# Patient Record
Sex: Female | Born: 1935 | Race: White | Hispanic: No | Smoking: Never smoker
Health system: Southern US, Community
[De-identification: ages and names within clinical notes are randomized; demographics above are authoritative.]

## PROBLEM LIST (undated history)

## (undated) DIAGNOSIS — F411 Generalized anxiety disorder: Secondary | ICD-10-CM

## (undated) DIAGNOSIS — G4700 Insomnia, unspecified: Secondary | ICD-10-CM

## (undated) DIAGNOSIS — IMO0002 Reserved for concepts with insufficient information to code with codable children: Secondary | ICD-10-CM

## (undated) DIAGNOSIS — F0390 Unspecified dementia without behavioral disturbance: Secondary | ICD-10-CM

---

## 2012-03-22 ENCOUNTER — Emergency Department: Payer: Self-pay | Admitting: Emergency Medicine

## 2012-03-22 LAB — CBC
HCT: 41.3 % (ref 35.0–47.0)
HGB: 13.9 g/dL (ref 12.0–16.0)
MCH: 32.3 pg (ref 26.0–34.0)
MCHC: 33.6 g/dL (ref 32.0–36.0)
MCV: 96 fL (ref 80–100)
RBC: 4.3 10*6/uL (ref 3.80–5.20)
RDW: 13.8 % (ref 11.5–14.5)
WBC: 6.5 10*3/uL (ref 3.6–11.0)

## 2012-03-22 LAB — COMPREHENSIVE METABOLIC PANEL
Albumin: 3.8 g/dL (ref 3.4–5.0)
Anion Gap: 8 (ref 7–16)
BUN: 10 mg/dL (ref 7–18)
Bilirubin,Total: 0.3 mg/dL (ref 0.2–1.0)
Calcium, Total: 9.3 mg/dL (ref 8.5–10.1)
Chloride: 109 mmol/L — ABNORMAL HIGH (ref 98–107)
EGFR (African American): >60
Glucose: 86 mg/dL (ref 65–99)
Osmolality: 280 (ref 275–301)
SGOT(AST): 21 U/L (ref 15–37)
SGPT (ALT): 23 U/L (ref 12–78)
Sodium: 141 mmol/L (ref 136–145)

## 2012-03-22 LAB — TROPONIN I: Troponin-I: <0.02 ng/mL

## 2012-03-22 LAB — CK TOTAL AND CKMB (NOT AT ARMC)
CK, Total: 62 U/L (ref 21–215)
CK-MB: 1 ng/mL (ref 0.5–3.6)

## 2012-04-12 ENCOUNTER — Emergency Department: Payer: Self-pay | Admitting: Emergency Medicine

## 2012-04-12 LAB — LIPASE, BLOOD: Lipase: 100 U/L (ref 73–393)

## 2012-04-12 LAB — COMPREHENSIVE METABOLIC PANEL
Alkaline Phosphatase: 139 U/L — ABNORMAL HIGH (ref 50–136)
Anion Gap: 6 — ABNORMAL LOW (ref 7–16)
BUN: 10 mg/dL (ref 7–18)
Bilirubin,Total: 0.6 mg/dL (ref 0.2–1.0)
Chloride: 107 mmol/L (ref 98–107)
Creatinine: 0.76 mg/dL (ref 0.60–1.30)
EGFR (African American): >60
EGFR (Non-African Amer.): >60
Osmolality: 277 (ref 275–301)
SGOT(AST): 49 U/L — ABNORMAL HIGH (ref 15–37)
Total Protein: 7 g/dL (ref 6.4–8.2)

## 2012-04-12 LAB — URINALYSIS, COMPLETE
Bacteria: NONE SEEN
Bilirubin,UR: NEGATIVE
Blood: NEGATIVE
Glucose,UR: NEGATIVE mg/dL (ref 0–75)
Leukocyte Esterase: NEGATIVE
Nitrite: NEGATIVE
Ph: 6 (ref 4.5–8.0)
Protein: NEGATIVE
Specific Gravity: 1.013 (ref 1.003–1.030)

## 2012-04-12 LAB — CBC
MCH: 32.8 pg (ref 26.0–34.0)
MCHC: 34.4 g/dL (ref 32.0–36.0)
MCV: 95 fL (ref 80–100)
RDW: 13.3 % (ref 11.5–14.5)
WBC: 6.5 10*3/uL (ref 3.6–11.0)

## 2012-04-12 LAB — TROPONIN I: Troponin-I: <0.02 ng/mL

## 2012-04-12 LAB — CK TOTAL AND CKMB (NOT AT ARMC): CK, Total: 118 U/L (ref 21–215)

## 2013-05-15 ENCOUNTER — Emergency Department: Payer: Self-pay | Admitting: Emergency Medicine

## 2013-05-15 LAB — BASIC METABOLIC PANEL
ANION GAP: 5 — AB (ref 7–16)
BUN: 13 mg/dL (ref 7–18)
CREATININE: 0.99 mg/dL (ref 0.60–1.30)
Calcium, Total: 9.2 mg/dL (ref 8.5–10.1)
Chloride: 106 mmol/L (ref 98–107)
Co2: 27 mmol/L (ref 21–32)
EGFR (African American): >60
GFR CALC NON AF AMER: 54 — AB
GLUCOSE: 110 mg/dL — AB (ref 65–99)
OSMOLALITY: 276 (ref 275–301)
Potassium: 3.4 mmol/L — ABNORMAL LOW (ref 3.5–5.1)
SODIUM: 138 mmol/L (ref 136–145)

## 2013-05-15 LAB — URINALYSIS, COMPLETE
BACTERIA: NONE SEEN
BLOOD: NEGATIVE
Bilirubin,UR: NEGATIVE
Glucose,UR: NEGATIVE mg/dL (ref 0–75)
KETONE: NEGATIVE
Nitrite: NEGATIVE
Ph: 6 (ref 4.5–8.0)
Protein: NEGATIVE
Specific Gravity: 1.005 (ref 1.003–1.030)
Squamous Epithelial: <1

## 2013-05-15 LAB — CBC
HCT: 40.3 % (ref 35.0–47.0)
HGB: 13.5 g/dL (ref 12.0–16.0)
MCH: 31.4 pg (ref 26.0–34.0)
MCHC: 33.5 g/dL (ref 32.0–36.0)
MCV: 94 fL (ref 80–100)
Platelet: 222 10*3/uL (ref 150–440)
RBC: 4.3 10*6/uL (ref 3.80–5.20)
RDW: 13.3 % (ref 11.5–14.5)
WBC: 5.3 10*3/uL (ref 3.6–11.0)

## 2013-06-24 ENCOUNTER — Inpatient Hospital Stay: Payer: Self-pay | Admitting: Internal Medicine

## 2013-06-24 LAB — COMPREHENSIVE METABOLIC PANEL
AST: 23 U/L (ref 15–37)
Albumin: 3.5 g/dL (ref 3.4–5.0)
Alkaline Phosphatase: 160 U/L — ABNORMAL HIGH
Anion Gap: 5 — ABNORMAL LOW (ref 7–16)
BILIRUBIN TOTAL: 0.3 mg/dL (ref 0.2–1.0)
BUN: 9 mg/dL (ref 7–18)
CO2: 27 mmol/L (ref 21–32)
CREATININE: 1.04 mg/dL (ref 0.60–1.30)
Calcium, Total: 9.4 mg/dL (ref 8.5–10.1)
Chloride: 109 mmol/L — ABNORMAL HIGH (ref 98–107)
EGFR (African American): 59 — ABNORMAL LOW
EGFR (Non-African Amer.): 51 — ABNORMAL LOW
Glucose: 104 mg/dL — ABNORMAL HIGH (ref 65–99)
OSMOLALITY: 280 (ref 275–301)
POTASSIUM: 3.8 mmol/L (ref 3.5–5.1)
SGPT (ALT): 31 U/L (ref 12–78)
Sodium: 141 mmol/L (ref 136–145)
Total Protein: 7 g/dL (ref 6.4–8.2)

## 2013-06-24 LAB — SEDIMENTATION RATE: Erythrocyte Sed Rate: 11 mm/hr (ref 0–30)

## 2013-06-24 LAB — CBC
HCT: 41.5 % (ref 35.0–47.0)
HGB: 13.2 g/dL (ref 12.0–16.0)
MCH: 30.4 pg (ref 26.0–34.0)
MCHC: 31.9 g/dL — AB (ref 32.0–36.0)
MCV: 96 fL (ref 80–100)
Platelet: 206 10*3/uL (ref 150–440)
RBC: 4.34 10*6/uL (ref 3.80–5.20)
RDW: 13.9 % (ref 11.5–14.5)
WBC: 4.9 10*3/uL (ref 3.6–11.0)

## 2013-06-25 LAB — CBC WITH DIFFERENTIAL/PLATELET
BASOS ABS: 0 10*3/uL (ref 0.0–0.1)
Basophil %: 0.6 %
EOS ABS: 0.1 10*3/uL (ref 0.0–0.7)
EOS PCT: 2.1 %
HCT: 38.2 % (ref 35.0–47.0)
HGB: 12.7 g/dL (ref 12.0–16.0)
Lymphocyte #: 2.1 10*3/uL (ref 1.0–3.6)
Lymphocyte %: 42.4 %
MCH: 31.7 pg (ref 26.0–34.0)
MCHC: 33.1 g/dL (ref 32.0–36.0)
MCV: 96 fL (ref 80–100)
MONOS PCT: 8.2 %
Monocyte #: 0.4 x10 3/mm (ref 0.2–0.9)
NEUTROS ABS: 2.4 10*3/uL (ref 1.4–6.5)
NEUTROS PCT: 46.7 %
Platelet: 195 10*3/uL (ref 150–440)
RBC: 4 10*6/uL (ref 3.80–5.20)
RDW: 13.6 % (ref 11.5–14.5)
WBC: 5.1 10*3/uL (ref 3.6–11.0)

## 2013-06-25 LAB — MAGNESIUM: MAGNESIUM: 1.9 mg/dL

## 2013-07-09 ENCOUNTER — Ambulatory Visit: Payer: Self-pay | Admitting: Orthopedic Surgery

## 2013-07-09 DIAGNOSIS — I10 Essential (primary) hypertension: Secondary | ICD-10-CM

## 2013-07-09 LAB — CBC
HCT: 39 % (ref 35.0–47.0)
HGB: 13 g/dL (ref 12.0–16.0)
MCH: 32.2 pg (ref 26.0–34.0)
MCHC: 33.2 g/dL (ref 32.0–36.0)
MCV: 97 fL (ref 80–100)
PLATELETS: 220 10*3/uL (ref 150–440)
RBC: 4.02 10*6/uL (ref 3.80–5.20)
RDW: 14 % (ref 11.5–14.5)
WBC: 5.4 10*3/uL (ref 3.6–11.0)

## 2013-07-09 LAB — PROTIME-INR
INR: 0.9
Prothrombin Time: 12.3 secs (ref 11.5–14.7)

## 2013-07-09 LAB — BASIC METABOLIC PANEL
Anion Gap: 6 — ABNORMAL LOW (ref 7–16)
BUN: 14 mg/dL (ref 7–18)
CHLORIDE: 108 mmol/L — AB (ref 98–107)
CO2: 27 mmol/L (ref 21–32)
Calcium, Total: 8.8 mg/dL (ref 8.5–10.1)
Creatinine: 0.91 mg/dL (ref 0.60–1.30)
EGFR (African American): >60
EGFR (Non-African Amer.): 60 — ABNORMAL LOW
Glucose: 156 mg/dL — ABNORMAL HIGH (ref 65–99)
Osmolality: 285 (ref 275–301)
POTASSIUM: 3.6 mmol/L (ref 3.5–5.1)
Sodium: 141 mmol/L (ref 136–145)

## 2013-07-09 LAB — URINALYSIS, COMPLETE
Bacteria: NONE SEEN
Bilirubin,UR: NEGATIVE
Blood: NEGATIVE
GLUCOSE, UR: NEGATIVE mg/dL (ref 0–75)
KETONE: NEGATIVE
Nitrite: NEGATIVE
PROTEIN: NEGATIVE
Ph: 5 (ref 4.5–8.0)
RBC,UR: <1 /HPF (ref 0–5)
SPECIFIC GRAVITY: 1.014 (ref 1.003–1.030)
WBC UR: 5 /HPF (ref 0–5)

## 2013-07-09 LAB — MRSA PCR SCREENING

## 2013-07-09 LAB — SEDIMENTATION RATE: Erythrocyte Sed Rate: 14 mm/hr (ref 0–30)

## 2013-07-09 LAB — APTT: Activated PTT: 26.9 secs (ref 23.6–35.9)

## 2013-07-17 ENCOUNTER — Inpatient Hospital Stay: Payer: Self-pay | Admitting: Orthopedic Surgery

## 2013-07-17 LAB — HEMOGLOBIN: HGB: 12.2 g/dL (ref 12.0–16.0)

## 2013-07-18 LAB — CBC WITH DIFFERENTIAL/PLATELET
Basophil #: 0 10*3/uL (ref 0.0–0.1)
Basophil %: 0.2 %
EOS ABS: 0 10*3/uL (ref 0.0–0.7)
Eosinophil %: 0 %
HCT: 27.1 % — ABNORMAL LOW (ref 35.0–47.0)
HGB: 9.4 g/dL — ABNORMAL LOW (ref 12.0–16.0)
LYMPHS ABS: 1 10*3/uL (ref 1.0–3.6)
LYMPHS PCT: 12.1 %
MCH: 33 pg (ref 26.0–34.0)
MCHC: 34.5 g/dL (ref 32.0–36.0)
MCV: 96 fL (ref 80–100)
MONO ABS: 0.6 x10 3/mm (ref 0.2–0.9)
Monocyte %: 7 %
NEUTROS ABS: 6.7 10*3/uL — AB (ref 1.4–6.5)
Neutrophil %: 80.7 %
PLATELETS: 172 10*3/uL (ref 150–440)
RBC: 2.84 10*6/uL — AB (ref 3.80–5.20)
RDW: 13.9 % (ref 11.5–14.5)
WBC: 8.3 10*3/uL (ref 3.6–11.0)

## 2013-07-18 LAB — BASIC METABOLIC PANEL
Anion Gap: 6 — ABNORMAL LOW (ref 7–16)
BUN: 9 mg/dL (ref 7–18)
CALCIUM: 7.9 mg/dL — AB (ref 8.5–10.1)
CHLORIDE: 110 mmol/L — AB (ref 98–107)
CO2: 23 mmol/L (ref 21–32)
Creatinine: 0.98 mg/dL (ref 0.60–1.30)
EGFR (Non-African Amer.): 54 — ABNORMAL LOW
Glucose: 131 mg/dL — ABNORMAL HIGH (ref 65–99)
OSMOLALITY: 278 (ref 275–301)
POTASSIUM: 4.3 mmol/L (ref 3.5–5.1)
Sodium: 139 mmol/L (ref 136–145)

## 2013-07-19 LAB — CBC WITH DIFFERENTIAL/PLATELET
BASOS PCT: 0.1 %
Basophil #: 0 10*3/uL (ref 0.0–0.1)
EOS PCT: 0.1 %
Eosinophil #: 0 10*3/uL (ref 0.0–0.7)
HCT: 23.6 % — ABNORMAL LOW (ref 35.0–47.0)
HGB: 8 g/dL — AB (ref 12.0–16.0)
Lymphocyte #: 1.2 10*3/uL (ref 1.0–3.6)
Lymphocyte %: 10.5 %
MCH: 32.3 pg (ref 26.0–34.0)
MCHC: 33.9 g/dL (ref 32.0–36.0)
MCV: 95 fL (ref 80–100)
Monocyte #: 0.8 x10 3/mm (ref 0.2–0.9)
Monocyte %: 7 %
NEUTROS ABS: 9.8 10*3/uL — AB (ref 1.4–6.5)
NEUTROS PCT: 82.3 %
PLATELETS: 155 10*3/uL (ref 150–440)
RBC: 2.48 10*6/uL — AB (ref 3.80–5.20)
RDW: 13.7 % (ref 11.5–14.5)
WBC: 11.9 10*3/uL — AB (ref 3.6–11.0)

## 2013-07-19 LAB — BASIC METABOLIC PANEL
ANION GAP: 5 — AB (ref 7–16)
BUN: 12 mg/dL (ref 7–18)
Calcium, Total: 8.1 mg/dL — ABNORMAL LOW (ref 8.5–10.1)
Chloride: 108 mmol/L — ABNORMAL HIGH (ref 98–107)
Co2: 25 mmol/L (ref 21–32)
Creatinine: 0.76 mg/dL (ref 0.60–1.30)
EGFR (African American): >60
EGFR (Non-African Amer.): >60
GLUCOSE: 118 mg/dL — AB (ref 65–99)
Osmolality: 277 (ref 275–301)
POTASSIUM: 4.1 mmol/L (ref 3.5–5.1)
Sodium: 138 mmol/L (ref 136–145)

## 2013-07-19 LAB — HEMOGLOBIN: HGB: 7.3 g/dL — ABNORMAL LOW (ref 12.0–16.0)

## 2013-07-20 LAB — CBC WITH DIFFERENTIAL/PLATELET
BASOS ABS: 0 10*3/uL (ref 0.0–0.1)
Basophil %: 0.2 %
EOS PCT: 0.3 %
Eosinophil #: 0 10*3/uL (ref 0.0–0.7)
HCT: 26 % — ABNORMAL LOW (ref 35.0–47.0)
HGB: 8.8 g/dL — ABNORMAL LOW (ref 12.0–16.0)
Lymphocyte #: 1.4 10*3/uL (ref 1.0–3.6)
Lymphocyte %: 12.4 %
MCH: 31.4 pg (ref 26.0–34.0)
MCHC: 33.9 g/dL (ref 32.0–36.0)
MCV: 93 fL (ref 80–100)
Monocyte #: 0.5 x10 3/mm (ref 0.2–0.9)
Monocyte %: 4.7 %
NEUTROS ABS: 9.3 10*3/uL — AB (ref 1.4–6.5)
NEUTROS PCT: 82.4 %
Platelet: 161 10*3/uL (ref 150–440)
RBC: 2.81 10*6/uL — ABNORMAL LOW (ref 3.80–5.20)
RDW: 14.9 % — AB (ref 11.5–14.5)
WBC: 11.3 10*3/uL — ABNORMAL HIGH (ref 3.6–11.0)

## 2013-07-20 LAB — BASIC METABOLIC PANEL
Anion Gap: 4 — ABNORMAL LOW (ref 7–16)
BUN: 19 mg/dL — AB (ref 7–18)
Calcium, Total: 8.4 mg/dL — ABNORMAL LOW (ref 8.5–10.1)
Chloride: 106 mmol/L (ref 98–107)
Co2: 26 mmol/L (ref 21–32)
Creatinine: 1.02 mg/dL (ref 0.60–1.30)
GFR CALC NON AF AMER: 52 — AB
Glucose: 106 mg/dL — ABNORMAL HIGH (ref 65–99)
Osmolality: 275 (ref 275–301)
Potassium: 4.2 mmol/L (ref 3.5–5.1)
SODIUM: 136 mmol/L (ref 136–145)

## 2013-07-21 LAB — HEMOGLOBIN: HGB: 8.6 g/dL — AB (ref 12.0–16.0)

## 2013-07-22 ENCOUNTER — Encounter: Payer: Self-pay | Admitting: Internal Medicine

## 2013-07-26 ENCOUNTER — Emergency Department: Payer: Self-pay | Admitting: Emergency Medicine

## 2013-07-26 LAB — URINALYSIS, COMPLETE
BLOOD: NEGATIVE
Bacteria: NONE SEEN
Bilirubin,UR: NEGATIVE
GLUCOSE, UR: NEGATIVE mg/dL (ref 0–75)
KETONE: NEGATIVE
Leukocyte Esterase: NEGATIVE
NITRITE: NEGATIVE
PROTEIN: NEGATIVE
Ph: 5 (ref 4.5–8.0)
Specific Gravity: 1.016 (ref 1.003–1.030)
Squamous Epithelial: 1
WBC UR: 2 /HPF (ref 0–5)

## 2013-07-26 LAB — COMPREHENSIVE METABOLIC PANEL
Albumin: 2 g/dL — ABNORMAL LOW (ref 3.4–5.0)
Alkaline Phosphatase: 177 U/L — ABNORMAL HIGH
Anion Gap: 6 — ABNORMAL LOW (ref 7–16)
BILIRUBIN TOTAL: 0.4 mg/dL (ref 0.2–1.0)
BUN: 9 mg/dL (ref 7–18)
Calcium, Total: 8.8 mg/dL (ref 8.5–10.1)
Chloride: 105 mmol/L (ref 98–107)
Co2: 28 mmol/L (ref 21–32)
Creatinine: 0.82 mg/dL (ref 0.60–1.30)
EGFR (African American): >60
EGFR (Non-African Amer.): >60
Glucose: 120 mg/dL — ABNORMAL HIGH (ref 65–99)
Osmolality: 277 (ref 275–301)
POTASSIUM: 3.7 mmol/L (ref 3.5–5.1)
SGOT(AST): 44 U/L — ABNORMAL HIGH (ref 15–37)
SGPT (ALT): 38 U/L (ref 12–78)
Sodium: 139 mmol/L (ref 136–145)
TOTAL PROTEIN: 5.6 g/dL — AB (ref 6.4–8.2)

## 2013-07-26 LAB — TROPONIN I: Troponin-I: <0.02 ng/mL

## 2013-07-26 LAB — CBC
HCT: 29.3 % — ABNORMAL LOW (ref 35.0–47.0)
HGB: 9.5 g/dL — ABNORMAL LOW (ref 12.0–16.0)
MCH: 30.8 pg (ref 26.0–34.0)
MCHC: 32.5 g/dL (ref 32.0–36.0)
MCV: 95 fL (ref 80–100)
PLATELETS: 312 10*3/uL (ref 150–440)
RBC: 3.09 10*6/uL — ABNORMAL LOW (ref 3.80–5.20)
RDW: 14.9 % — ABNORMAL HIGH (ref 11.5–14.5)
WBC: 8.6 10*3/uL (ref 3.6–11.0)

## 2013-07-26 LAB — PROTIME-INR
INR: 1.1
Prothrombin Time: 14.3 secs (ref 11.5–14.7)

## 2013-07-26 LAB — APTT: Activated PTT: 24.2 secs (ref 23.6–35.9)

## 2013-07-29 ENCOUNTER — Emergency Department: Payer: Self-pay | Admitting: Emergency Medicine

## 2013-07-30 LAB — CBC
HCT: 29.2 % — AB (ref 35.0–47.0)
HGB: 9.5 g/dL — AB (ref 12.0–16.0)
MCH: 30.9 pg (ref 26.0–34.0)
MCHC: 32.4 g/dL (ref 32.0–36.0)
MCV: 95 fL (ref 80–100)
Platelet: 391 10*3/uL (ref 150–440)
RBC: 3.07 10*6/uL — ABNORMAL LOW (ref 3.80–5.20)
RDW: 14.9 % — AB (ref 11.5–14.5)
WBC: 8.5 10*3/uL (ref 3.6–11.0)

## 2013-07-30 LAB — COMPREHENSIVE METABOLIC PANEL
ALT: 31 U/L (ref 12–78)
ANION GAP: 7 (ref 7–16)
Albumin: 2.2 g/dL — ABNORMAL LOW (ref 3.4–5.0)
Alkaline Phosphatase: 199 U/L — ABNORMAL HIGH
BILIRUBIN TOTAL: 0.3 mg/dL (ref 0.2–1.0)
BUN: 7 mg/dL (ref 7–18)
CALCIUM: 8.8 mg/dL (ref 8.5–10.1)
Chloride: 106 mmol/L (ref 98–107)
Co2: 27 mmol/L (ref 21–32)
Creatinine: 0.64 mg/dL (ref 0.60–1.30)
EGFR (African American): >60
Glucose: 101 mg/dL — ABNORMAL HIGH (ref 65–99)
Osmolality: 278 (ref 275–301)
Potassium: 3.4 mmol/L — ABNORMAL LOW (ref 3.5–5.1)
SGOT(AST): 29 U/L (ref 15–37)
SODIUM: 140 mmol/L (ref 136–145)
TOTAL PROTEIN: 5.8 g/dL — AB (ref 6.4–8.2)

## 2013-07-30 LAB — TROPONIN I
Troponin-I: <0.02 ng/mL
Troponin-I: <0.02 ng/mL

## 2013-07-30 LAB — APTT: ACTIVATED PTT: 30.4 s (ref 23.6–35.9)

## 2013-07-30 LAB — CK TOTAL AND CKMB (NOT AT ARMC)
CK, Total: 66 U/L
CK-MB: 0.9 ng/mL (ref 0.5–3.6)

## 2013-07-30 LAB — PROTIME-INR
INR: 1
PROTHROMBIN TIME: 12.9 s (ref 11.5–14.7)

## 2013-08-02 ENCOUNTER — Emergency Department: Payer: Self-pay | Admitting: Emergency Medicine

## 2013-08-13 ENCOUNTER — Encounter: Payer: Self-pay | Admitting: Internal Medicine

## 2013-09-29 ENCOUNTER — Emergency Department: Payer: Self-pay | Admitting: Emergency Medicine

## 2013-09-29 LAB — TROPONIN I: Troponin-I: <0.02 ng/mL

## 2013-09-29 LAB — BASIC METABOLIC PANEL
Anion Gap: 8 (ref 7–16)
BUN: 7 mg/dL (ref 7–18)
CO2: 27 mmol/L (ref 21–32)
CREATININE: 0.79 mg/dL (ref 0.60–1.30)
Calcium, Total: 8.9 mg/dL (ref 8.5–10.1)
Chloride: 107 mmol/L (ref 98–107)
EGFR (African American): >60
Glucose: 110 mg/dL — ABNORMAL HIGH (ref 65–99)
OSMOLALITY: 282 (ref 275–301)
POTASSIUM: 3.5 mmol/L (ref 3.5–5.1)
SODIUM: 142 mmol/L (ref 136–145)

## 2013-09-29 LAB — CBC
HCT: 37.6 % (ref 35.0–47.0)
HGB: 12.1 g/dL (ref 12.0–16.0)
MCH: 30.7 pg (ref 26.0–34.0)
MCHC: 32.2 g/dL (ref 32.0–36.0)
MCV: 95 fL (ref 80–100)
PLATELETS: 222 10*3/uL (ref 150–440)
RBC: 3.95 10*6/uL (ref 3.80–5.20)
RDW: 14.3 % (ref 11.5–14.5)
WBC: 5.8 10*3/uL (ref 3.6–11.0)

## 2013-09-30 LAB — URINALYSIS, COMPLETE
BLOOD: NEGATIVE
Bacteria: NONE SEEN
Bilirubin,UR: NEGATIVE
Glucose,UR: NEGATIVE mg/dL (ref 0–75)
Leukocyte Esterase: NEGATIVE
Nitrite: NEGATIVE
Ph: 7 (ref 4.5–8.0)
Protein: NEGATIVE
Specific Gravity: 1.015 (ref 1.003–1.030)
Squamous Epithelial: <1
WBC UR: 2 /HPF (ref 0–5)

## 2013-10-03 ENCOUNTER — Emergency Department: Payer: Self-pay | Admitting: Emergency Medicine

## 2013-10-06 ENCOUNTER — Emergency Department: Payer: Self-pay | Admitting: Internal Medicine

## 2014-01-22 ENCOUNTER — Ambulatory Visit: Payer: Self-pay | Admitting: Ophthalmology

## 2014-02-05 ENCOUNTER — Ambulatory Visit: Payer: Self-pay | Admitting: Ophthalmology

## 2014-05-07 ENCOUNTER — Emergency Department: Payer: Self-pay | Admitting: Student

## 2014-07-06 NOTE — Consult Note (Signed)
Brief Consult Note: Diagnosis: hypertension, dementia, s/p left hip revision.   Patient was seen by consultant.   Consult note dictated.   Orders entered.   Comments: holding amlodipine due to relatively low BP.  Has had trouble with significant agitation in prior hospital stays, per family; haldol ordered PRN and discussed with family members.  Electronic Signatures: Curtis SitesKlein, Bert J (MD)  (Signed [phone]-May-15 20:01)  Authored: Brief Consult Note   Last Updated: 05-May-15 20:01 by Curtis SitesKlein, Bert J (MD)

## 2014-07-06 NOTE — Consult Note (Signed)
PATIENT NAME:  [name], Kathleen MR#:  [id number] DATE OF BIRTH:  [birth year]  DATE OF CONSULTATION:  07/17/2013  REFERRING PHYSICIAN:  Leitha SchullerMichael J. Menz, MD  CONSULTING PHYSICIAN:  Lynnea FerrierBert J. Annita Ratliff III, MD  PRIMARY CARE PHYSICIAN: Dr. Einar CrowMarshall Anderson.   REASON FOR CONSULTATION: Management of hypertension, dementia.   HISTORY OF PRESENT ILLNESS: An 81 year old female with a history of hypertension, hyperlipidemia, osteoarthritis, dementia, who is status post total hip revision on the left for a loose total hip replacement with ORIF intraoperative femur fracture. Postoperatively, she is relatively hypotensive, though seems comfortable. She is conversing easily, answers questions fairly appropriately, though does have some hearing loss. She has family members at her bedside, and they feel she is doing well currently. Prior to admission for surgery, she had not had any significant troubles with her health, including no fevers, chills, chest pain, shortness of breath. Family members note that she has had trouble with severe agitation, especially at nighttime, when she has been in the hospital previously and are asking for something to be used as needed should this develop during this hospitalization.   PAST MEDICAL HISTORY:  1. Hypertension.  2. Hyperlipidemia.  3. Osteoarthritis with history of left hip replacement.  4. Dementia.  5. 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   ALLERGIES: No known drug allergies.   MEDICATIONS:  1. Coreg 6.25 mg p.o. b.i.d.  2. Lipitor 40 mg p.o. at bedtime.  3. Amlodipine 10 mg p.o. daily.  4. Singulair 10 mg p.o. daily.  5. Tramadol 50 mg p.o. q.6 hours as needed for pain.  6. Seroquel 25 mg p.o. in a.m., 50 mg p.o. at bedtime.   SOCIAL HISTORY: No alcohol or tobacco.   FAMILY HISTORY: Cancer, coronary artery disease, COPD.   REVIEW OF SYSTEMS: Please see HPI. Denies recent difficulty swallowing. Denies constipation. Denies any other current complaints. The remainder  of review of systems is negative.   PHYSICAL EXAMINATION:  VITAL SIGNS: Temperature 97, pulse 82, blood pressure 109/74, saturation 94% on 2 liters.  GENERAL: Elderly female, in no acute distress.  EYES: Pupils round and reactive to light. Lids and conjunctivae unremarkable.  EARS, NOSE AND THROAT: Unremarkable. The oropharynx is moist without lesions.  NECK: Supple. Trachea midline. No thyromegaly.  CARDIOVASCULAR: Regular rate and rhythm without gallops, rubs.  LUNGS: Clear bilaterally without wheeze or retractions.  ABDOMEN: Soft, nontender, nondistended. Quiet bowel sounds without guarding, rebound.  SKIN: Postoperative changes on the left hip. No other significant rashes or nodules.  LYMPH NODES: No cervical or supraclavicular nodes.  MUSCULOSKELETAL: No clubbing, cyanosis or significant edema. Postoperative changes as noted above.  NEUROLOGIC: Some hearing loss as noted above. Cranial nerves otherwise appear to be intact. Grip is equal bilaterally. Able to wiggle toes bilaterally, with light touch grossly intact.   DATA: Hemoglobin 12.2 postoperatively.   IMPRESSION AND PLAN:  1. Status post revision left hip. Pain management, anticoagulation, bowel regimen deferred to orthopedics. Monitoring hemoglobin. Watching renal function.  2. Hypertension: Hold amlodipine and monitor heart rate, blood pressure. 3. Hyperlipidemia: She is on Lipitor, and this was continued.  4. Dementia with history of agitation: Discussed with the patient's family members. Will continue her home dose of quetiapine for now; added Haldol to be used as needed with side effects and risks briefly discussed with family members. They are comfortable using this as needed.   Thank you for allowing me to participate in the care of this patient. Will follow with you during this hospitalization.  ____________________________ Lynnea Ferrier, MD bjk:gb D: 07/17/2013 20:05:48 ET T: 07/18/2013 01:16:51  ET JOB#: [id number]  cc: Curtis Sites III, MD, <Dictator> Daniel Nones MD ELECTRONICALLY SIGNED 07/18/2013 13:08

## 2014-07-06 NOTE — Discharge Summary (Signed)
PATIENT NAME:  [name], [name] MR#:  [id number] DATE OF BIRTH:  12-29-1931  DATE OF ADMISSION:  07/17/2013 DATE OF DISCHARGE:    ADMITTING DIAGNOSIS: Loose total hip on the left side with acetabular and femoral components.   DISCHARGE DIAGNOSIS: Loose total hip on the left side with acetabular and femoral components.  OPERATION: On 07/17/2013, the patient had revision of acetabular and femoral components.  COMPLICATIONS: Intraoperative femur fracture treated with an ORIF.   SURGEON: Lacretia Leigh, M.D.   ASSISTANT: Cranston Neighbor, PA-C.   ANESTHESIA: Spinal.   ESTIMATED BLOOD LOSS: 1150 mL, with 625 mL reinfused.   IMPLANTS: DePuy Pinnacle Gription acetabular shell, multihole, 52 mm diameter, with revision augment 15 mm for 50 to 52 size cup, multiple prescription screws, 20 mm cancellous screw for cup fixation. Additionally, a Medacta Quadra R size 2 HA coated stem with L +3.5 mm, 32 mm head, 3 cables and sleeves, and a Synthes periarticular plate.   COMPLICATIONS: The femur fracture was treated with open reduction internal fixation.   SPECIMENS: None.  HISTORY: The patient is an 81 year old female who presented after 20 years history from a total hip replacement on the left side. The patient has had continued pain and difficulty with activities of daily living. The patient has been refractory to conservative treatment. X-rays revealed loosening of the acetabular component and the femoral component.   PHYSICAL EXAMINATION: GENERAL: Alert female with an antalgic gait and ambulated with a walker.  HEART: Regular rate and rhythm.  LUNGS: Clear to auscultation.  MUSCULOSKELETAL: In regard to the left hip, the patient has no swelling or erythema. The patient does have tenderness to palpation of the anterior hip joint. The patient has discomfort with hip flexion and rotation. The patient has good strength with testing, and neurovascular is intact.   HOSPITAL COURSE: After initial admission on  07/17/2013, the patient was brought back to the orthopedic floor. On postoperative day 1, the patient had a hemoglobin of 9.4. The following morning it was at 8.0, and she received 2 units of transfused blood that day. That evening, her hemoglobin was still at 7.3, and received 1 more unit of transfused blood. On the day of discharge, her hemoglobin stabilized at 8.6. The patient worked with physical therapy, but was only able to ambulate bed to chair doing 2 feet, based on her pain as well as her mild dementia. The patient had a bowel movement, and was able to be discharged to physical therapy on 07/21/2013.   CONDITION AT DISCHARGE: Stable.   DISCHARGE INSTRUCTIONS: The patient is to do partial weight-bearing on the affected leg using a walker. The patient is to use thigh-high TED hose on both legs. The patient will elevate her heels off the bed. The patient will use an incentive spirometer. The patient will do cough and deep breathing. Diet is regular. The patient will use ice pack on the affected leg. She will try to keep her dressing clean and dry, try not to get it dirty. The patient will have her dressing changed on a p.r.n. basis. The patient will call the clinic if there is any bright red bleeding, calf pain, or bowel or bladder difficulty, or fever greater than 101.5. The patient will do physical therapy and occupational therapy per protocol. The patient will call the clinic for follow up in about two weeks.   DISCHARGE MEDICATIONS: Carvedilol 1 tablet b.i.d., montelukast 10 mg 1 tablet daily, atorvastatin 1 tablet daily, omeprazole 20 mg 1 capsule daily, amlodipine  10 mg 0.5 tablets daily, quetiapine 25 mg 2 tablets at nighttime, Tylenol 500 mg 1 tablet q.4 hours for fever greater than 100.4, oxycodone 5 mg 1 tablet q.4 hours p.r.n. for severe pain, tramadol 50 mg 1 tablet q.4 hours as needed for less severe pain, milk of magnesia 30 mL 2 times a day for constipation, Lovenox 40 mg subcutaneous once  a day for 14 days, and bisacodyl 10 mg per rectally daily or p.r.n.   ____________________________ Shela CommonsJ. Dedra Skeensodd Willys Salvino, [place]PA jtm:cg D: 07/21/2013 06:52:59 ET T: 07/21/2013 07:54:42 ET JOB#: [id number]  cc: J. Dedra Skeensodd Dema Timmons, [place]PA, <Dictator> J Briannia Laba [facility] PA ELECTRONICALLY SIGNED 07/22/2013 6:59

## 2014-07-06 NOTE — Op Note (Signed)
PATIENT NAME:  [name], [name] MR#:  [id number] DATE OF BIRTH:  1931/04/07  DATE OF PROCEDURE:  02/05/2014  PREOPERATIVE DIAGNOSIS:  Nuclear sclerotic cataract of the right eye.   POSTOPERATIVE DIAGNOSIS:  Nuclear sclerotic cataract of the right eye.   OPERATIVE PROCEDURE:  Cataract extraction by phacoemulsification with implant of intraocular lens to right eye.   SURGEON:  Galen ManilaWilliam Salaya Holtrop, MD.   ANESTHESIA:  1. Managed anesthesia care.  2. Topical tetracaine drops followed by 2% Xylocaine jelly applied in the preoperative holding area.   COMPLICATIONS:  None.   TECHNIQUE:   Stop and chop.  DESCRIPTION OF PROCEDURE:  The patient was examined and consented in the preoperative holding area where the aforementioned topical anesthesia was applied to the right eye and then brought back to the operating room where the right eye was prepped and draped in the usual sterile ophthalmic fashion and a lid speculum was placed. A paracentesis was created with the side port blade and the anterior chamber was filled with viscoelastic. A near clear corneal incision was performed with the steel keratome. A continuous curvilinear capsulorrhexis was performed with a cystotome followed by the capsulorrhexis forceps. Hydrodissection and hydrodelineation were carried out with BSS on a blunt cannula. The lens was removed in a stop and chop technique and the remaining cortical material was removed with the irrigation-aspiration handpiece. The capsular bag was inflated with viscoelastic and the Tecnis ZCB00 27.0 diopter lens, serial number [id number] was placed in the capsular bag without complication. The remaining viscoelastic was removed from the eye with the irrigation-aspiration handpiece. The wounds were hydrated. The anterior chamber was flushed with Miostat and the eye was inflated to physiologic pressure. 0.1 mL of cefuroxime concentration 10 mg/mL was placed in the anterior chamber. The wounds were found to be water  tight. The eye was dressed with Vigamox. The patient was given protective glasses to wear throughout the day and a shield with which to sleep tonight. The patient was also given drops with which to begin a drop regimen today and will follow-up with me in one day.    ____________________________ Jerilee FieldWilliam L. Blakeley Margraf, MD wlp:bm D: 02/05/2014 17:28:27 ET T: 02/05/2014 22:46:04 ET JOB#: [id number]  cc: Legrande Hao L. Emylee Decelle, MD, <Dictator> Jerilee FieldWILLIAM L Jusitn Salsgiver MD ELECTRONICALLY SIGNED 02/06/2014 15:25

## 2014-07-06 NOTE — Consult Note (Signed)
Brief Consult Note: Diagnosis: Left hip THA, pain.   Patient was seen by consultant.   Consult note dictated.   Comments: Implant from 1970s per patient, left total hip arthroplasty.  All polyethylene cemented cup with cemented stem, possible vent hole lateral cortex.    Patient will require revision THA scheduled as an outpatient.  No inpatient treatment is necessary or advisable.  Patient will need a CT scan to better eval bone quality and implant.  Will require planning to ensure availability of appropriate implants.   Patient will follow-up with Kennedy BuckerMichael Menz, MD as an outpatient to discuss further. Pain control as inpatient. Call with questions.  Electronic Signatures: Murlean Harkamasunder, Laine Fonner (MD)  (Signed 13-Apr-15 13:00)  Authored: Brief Consult Note   Last Updated: 13-Apr-15 13:00 by Murlean Harkamasunder, Azariah Bonura (MD)

## 2014-07-06 NOTE — Op Note (Signed)
PATIENT NAME:  [name], Kathleen MR#:  [id number] DATE OF BIRTH:  17-Aug-1931  DATE OF PROCEDURE:  07/17/2013  PREOPERATIVE DIAGNOSIS: Loose total hip, left hip, acetabular and femoral components.   POSTOPERATIVE DIAGNOSIS: Loose total hip, left hip, acetabular and femoral components.   PROCEDURE: Revision of acetabular and femoral components.   COMPLICATIONS: Intraoperative femur fracture treated with ORIF.   SURGEON: Dr. Rosita KeaMenz  ASSISTANT: Cranston Neighborhris Gaines, PA-C.   ANESTHESIA: Spinal.   DESCRIPTION OF PROCEDURE: The patient was brought to the operating room, and after adequate anesthesia was obtained, the right leg was placed on a well-padded table, left leg in Medacta traction boot. The C-arm was brought in and initial hip x-rays taken. Hip was then prepped and draped in the usual sterile fashion with appropriate patient identification, timeout procedures completed. Cell Saver was present. Incision was made with direct anterior approach, extending from just distal to the anterior/superior iliac spine distally past the greater trochanter. The tensor fascia lata muscular fascia was incised and the muscle retracted laterally. Deep fascia was then incised and the lateral femoral circumflex vessel ligated. The anterior capsule was then exposed and a large capsulotomy created with exposure of the femoral neck and a portion of the acetabulum. The deep incision was extended to allow for adequate exposure of the superior aspect of the periphery of the acetabulum.  When this was present and scar had been removed adequately, the hip was dislocated, the stem needed to be removed because of it being monoblock and a large head.  The partial release was carried out of the pubofemoral and ischiofemoral ligaments, allow for mobilization.  The stem came out easily and it was apparently loose. Next, attention was turned back to the acetabulum. The prior acetabular component was grossly loose and could be pulled from the cement  without difficulty. The cement was removed in pieces; some of it had broken up, and with a curette, the remaining portions could be removed without really any of the cement being adherent to the acetabulum.  There was extensive layer of scar tissue between the cement and acetabular host bone. This was debrided with use of a curette. Reaming was carried out trying to get back to the native acetabulum as well as reamings more superiorly for the augment to fill this defect to provide stability. There was also a smaller defect superiorly that was curetted and then subsequently bone grafted with cortical cancellus chips with the reverse reamer. A 52 mm cup was chosen, placed in position, and then the15 mm augment obtained after rasping for it.  The 15 mm augment was impacted into place with cement applied to the acetabular component. To get immediate fixation, 3 of the 4 screw holes were filled and there was very good grab of the screws on the ileum with C-arm being utilized to avoid dangerous position of the screw. A superior screw was placed through the cup in a posterior superior aspect that also gave a very good bite and the whole construct appeared quite stable. A 32 mm cup neutral liner was impacted into place. Next, attention was turned to the femur. There was quite a bit of cement in the canal. This was removed slowly and with difficulty. There had been a previous apparently puncture of the bone on her initial surgery and there was a small bit of cement there. After removing the bone around the proximal portion of the canal, the more distal canal was attempted. It was attempted to remove this and this was  not successful. Attempt was made to recement the stem and it ended up penetrating the previously penetrated area laterally where the bone was quite weak and further cement removal was carried out. After this had been essentially completed with a broach for the Medacta Quadra R stem 1st broach being placed, there  was a crack from the prior hole in the lateral femur extending down to the supracondylar region. Because of this, additional incision was required, and the initial anterior incision was extended distally, and somewhat posteriorly to allow for an incision of  the IT band and elevation of the vastus lateralis to expose the fracture site. The fracture was exposed, reduced with traction and rotation, followed by placement of cables. Initially, 2 Dall-Miles cables were placed. The fracture was felt that a plate was also required so a long Synthes periarticular fracture plate was obtained and applied along the lateral femur with 2 initial Dall-Miles placed around this followed by an additional Synthes cable at the level of the distal fracture with screw holes distally present for later screw fixation. This gave essentially anatomic alignment. There was sequential broaching after this and the #2 gave a nice fit, and with trialing, it appeared leg lengths were appropriate. This final stem was inserted and trials placed. An L 32 mm head was impacted onto the stem and the hip was reduced. It appeared stable to 90 degrees external rotation.  The lengths appeared equal. The distal 2 screw holes were then filled through the plate using standard technique, locking screws, drilling, measuring and placing the locking screws to aid in the fracture fixation. Additionally, there was a fracture of the greater trochanter that appeared stable. The wounds were thoroughly irrigated. The proximal soft tissues were infiltrated with 30 mL 0.25% Sensorcaine with epinephrine. The wound was closed with a heavy quill for the deep fascia, 2-0 quill subcutaneously and after subcutaneous drain placed, followed by skin staples. Xeroform, 4 x 4's, ABD tape. The patient was then sent to recovery room, stable condition.   ESTIMATED BLOOD LOSS: 1150, with 625 reinfused.   IMPLANTS: There was a Depuy Pinnacle Gription acetabular shell multihole 52 mm  diameter with a revision augment 15 mm for the  50-52 cup, multiple prescription screws, and a 20 mm cancellous screw for the cup fixation. Additionally, there was the  [employer] R size 2 HA coated stem with an L plus 3.5 mm 32 mm head, 3 cable and sleeves, and a synthes periarticular plate.   CONDITION: To recovery room, stable.   COMPLICATIONS: Femur fracture treated with open reduction internal fixation. No specimen.    ____________________________ Leitha Schuller, MD mjm:dd D: 07/17/2013 17:59:41 ET T: 07/18/2013 03:09:52 ET JOB#: [id number]  cc: Leitha Schuller, MD, <Dictator> Leitha Schuller MD ELECTRONICALLY SIGNED 07/18/2013 8:06

## 2014-07-06 NOTE — H&P (Signed)
PATIENT NAME:  [name], [name] MR#:  [id number] DATE OF BIRTH:  1931-12-05  DATE OF ADMISSION:  06/24/2013  PRIMARY CARE PHYSICIAN:  Ocie Cornfield. Ouida Sills, MD  REFERRING PHYSICIAN:  Verdia Kuba. Paduchowski, MD  CHIEF COMPLAINT:  Left hip pain today.   HISTORY OF PRESENT ILLNESS: An 81 year old Caucasian female with a history of hypertension, dementia, who presented to the ED with left hip pain today, unable to walk. The patient denies any fever or chills. No cough, sputum, shortness of breath. Denies any leg swelling, orthopnea, nocturnal dyspnea. The patient is living alone per ED physician. Orthopedic surgeon suggested to admit patient. The patient will need hip surgery. The patient will be admitted under medical service.    PAST MEDICAL HISTORY: Hypertension and dementia.   PAST SURGICAL HISTORY: Cataract extraction tubal ligation, hip replacement on the left side, appendectomy, cholecystectomy.   SOCIAL HISTORY: No smoking or drinking or illicit drugs.   FAMILY HISTORY: Hypertension.   ALLERGIES: None.   HOME MEDICATIONS:  Coreg 6.25 mg p.o. b.i.d., atorvastatin 40 mg p.o. daily, Norvasc 10 mg p.o. daily, Zofran 4 mg 1 tablet 3 times a day p.r.n., tramadol 50 mg p.o. 1 to 2 tablets every 6 hours p.r.n., omeprazole 20 mg p.o. daily, Singulair 10 mg once a day, donepezil 5 mg p.o. in the evening.   REVIEW OF SYSTEMS:    CONSTITUTIONAL: The patient denies any fever or chills. No headache or dizziness. No weakness.  EYES: No double vision, blurry vision.  ENT: No postnasal drip, slurred speech or dysphagia.  CARDIOVASCULAR: No chest pain, palpitation, orthopnea or nocturnal dyspnea. No leg edema.  PULMONARY: No cough, sputum, shortness of breath or hemoptysis.  GASTROINTESTINAL: No abdominal pain, nausea, vomiting or diarrhea. No melena or bloody stool.  GENITOURINARY: No dysuria, hematuria or incontinence.  SKIN: No rash or jaundice.  NEUROLOGY: No syncope, loss of consciousness or seizure.   ENDOCRINE: No polyuria, polydipsia, heat or cold intolerance.  MUSCULOSKELETAL: Left hip pain.   PHYSICAL EXAMINATION: VITAL SIGNS: Temperature 98.3, blood pressure 154/73, pulse 80, respirations 16, O2 saturation 98% on room air.  GENERAL: The patient is alert, awake, oriented, in no acute distress.  HEENT: Pupils round, equal and reactive to light and accommodation. Moist oral mucosa. Clear oropharynx.  NECK: Supple. No JVD or carotid bruits. No lymphadenopathy.  No thyromegaly.  CARDIOVASCULAR: S1, S2. Regular rate, rhythm. No murmurs, rubs or gallops.    PULMONARY: Bilateral air entry. No wheezing or rales. No use of accessory muscle to breathe.  ABDOMEN: Soft. No distention or tenderness. No organomegaly. Bowel sounds present.  EXTREMITIES: No edema, clubbing or cyanosis. No calf tenderness. Bilateral pedal pulses present.  SKIN: No rash or jaundice.  NEUROLOGIC: The patient is alert, awake, demented, follows commands. No focal deficit. Power 3 to 4 out of 5. Sensation intact.   LABORATORY, DIAGNOSTIC AND RADIOLOGICAL DATA:   1.  Glucose 104, BUN 9, creatinine 1.04, electrolytes normal. CBC in normal range. ESR 11.   2. Left hip x-ray showed left hip prosthesis, a portion of the prosthesis which appears consistent with ring in the femoral neck region is continuous of uncertain significance, no acute fracture.  3.  EKG is pending.   IMPRESSION:  1. Left hip pain,  needs hip replacement.  2.  Hypertension.  3.  Dementia.   PLAN OF TREATMENT: 1.  The patient will be admitted under medical service. The patient has moderate risk for hip surgery. We will continue hypertension medication including Norvasc and  Coreg.  2.  Aspiration and fall precautions.  3.  Deep vein thrombosis prophylaxis after surgery.  4.  The patient is living alone, needs rehab placement after surgery.  5.  We discussed the patient's condition and plan of treatment with the patient and the patient's daughter. The  patient wants full code.   TIME SPENT: About 52 minutes.    ____________________________ Demetrios Loll, MD qc:cs D: 06/24/2013 19:44:00 ET T: 06/24/2013 20:00:34 ET JOB#: [id number]  cc: Demetrios Loll, MD, <Dictator> Demetrios Loll MD ELECTRONICALLY SIGNED 06/25/2013 10:56

## 2014-07-06 NOTE — Discharge Summary (Signed)
PATIENT NAME:  [name], Kathleen MR#:  [id number] DATE OF BIRTH:  Oct 25, 1931  DATE OF ADMISSION:  06/24/2013 DATE OF DISCHARGE:  06/26/2013  DISCHARGE DIAGNOSES: 1. Hip pain.  2. Osteoarthritis.   DISCHARGE MEDICATIONS: [facility].   History and physical done on admission.   HOSPITAL COURSE: The patient admitted with hip pain. ER was told by the orthopedic on call to admit the patient and she needed hip surgery. Once orthopedic consultation was obtained, decision not to operate on the patient was made.  CT of her hip was done, it showed severe changes status post hip replacement. She was discharge, will follow up with orthopedist for procedure.   ____________________________ Marya AmslerMarshall W. Dareen PianoAnderson, MD mwa:sg D: 07/02/2013 07:47:04 ET T: 07/02/2013 08:07:44 ET JOB#: [id number]  cc: Marya AmslerMarshall W. Dareen PianoAnderson, MD, <Dictator> Lauro RegulusMARSHALL W Mechell Girgis MD ELECTRONICALLY SIGNED 07/03/2013 8:02

## 2015-03-10 IMAGING — CT CT HEAD WITHOUT CONTRAST
4 of 6 series · 16 of 47 positions shown, 18 images · non-contrast
Comparison: 03/22/2012 head CT.

CLINICAL DATA: Head pain after fall.

EXAM:
CT HEAD WITHOUT CONTRAST
CT CERVICAL SPINE WITHOUT CONTRAST
TECHNIQUE: Multidetector CT imaging of the head and cervical spine was
performed following the standard protocol without intravenous
contrast. Multiplanar CT image reconstructions of the cervical spine
were also generated.

[Series 5: soft tissue · axial · 0.38mm/px · z∈[+171,+191]mm · 2 of 78 slices shown]
[im 10/78  brain]
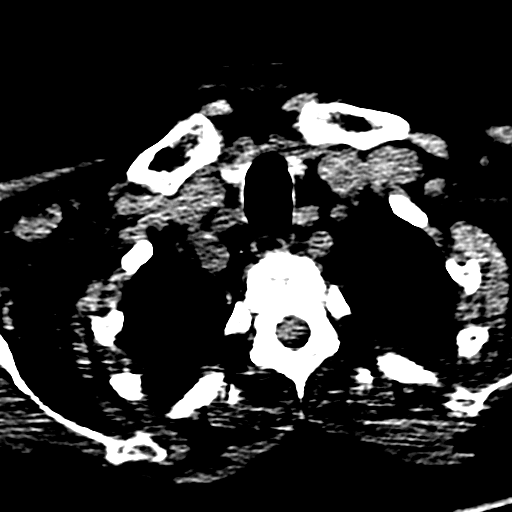
[im 20/78  brain]
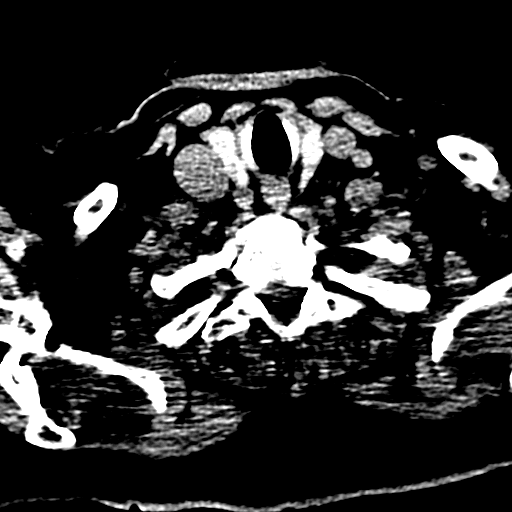

[Series 6: sagittal bone · sagittal · 0.31mm/px · 3 of 44 slices shown]
[im 15/44  brain]
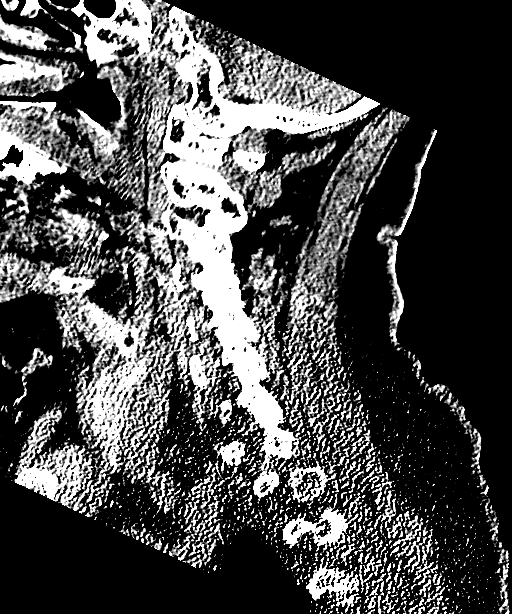
[im 22/44  brain]
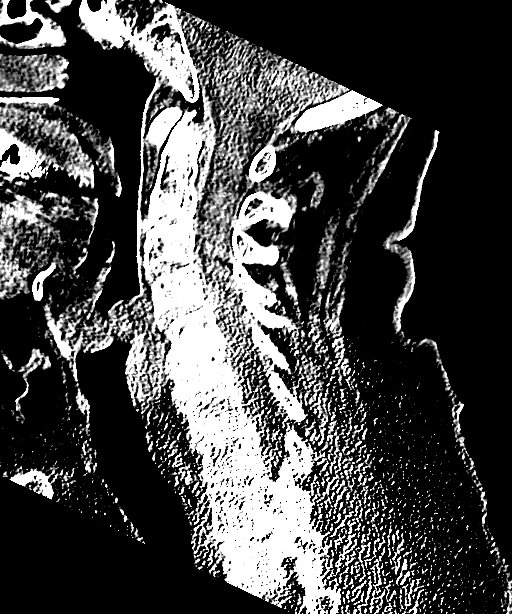
[im 29/44  brain]
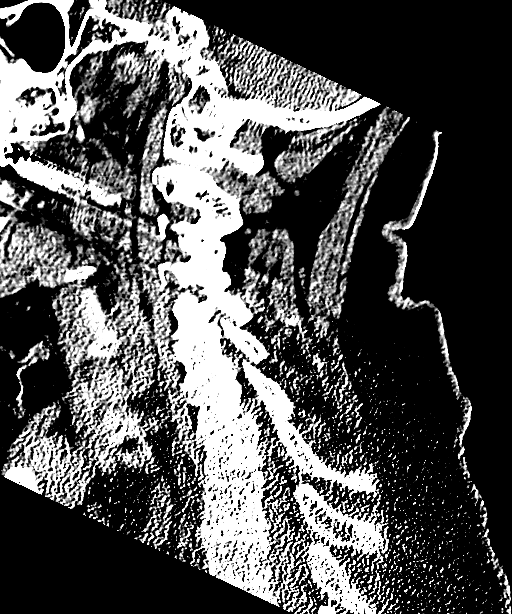

[Series 7: coronal bone · coronal · 0.31mm/px · 3 of 55 slices shown]
[im 19/55  brain]
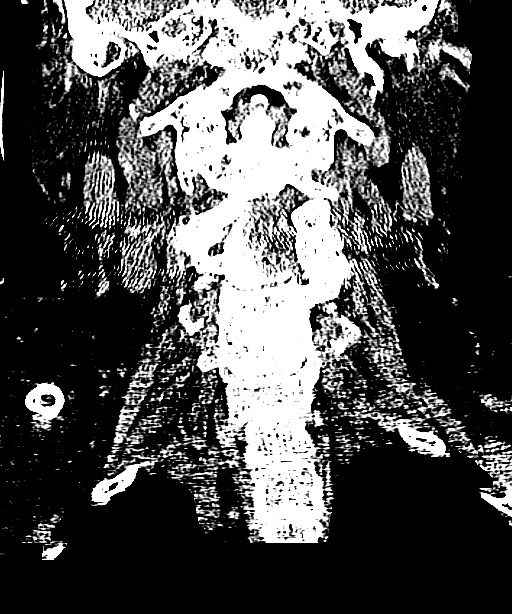
[im 25/55  brain]
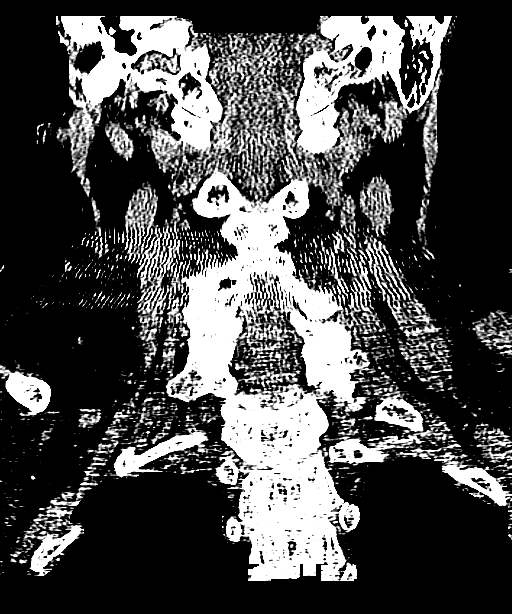
[im 31/55  brain]
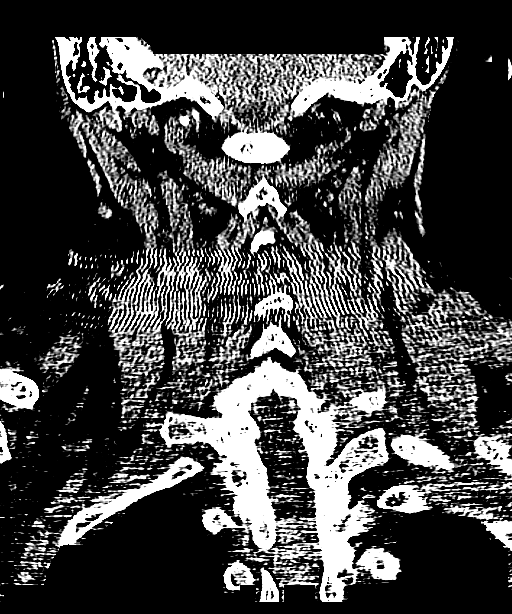

[Series 8: axial · axial · 0.31mm/px · z∈[+137,+250]mm · 8 of 87 slices shown, 10 images]
[im 10/87  brain]
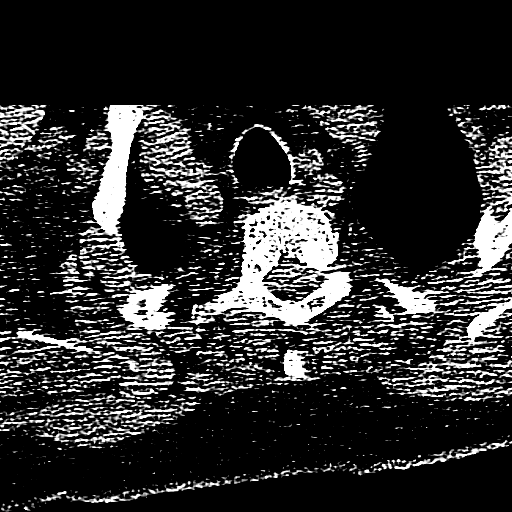
[im 10/87  bone]
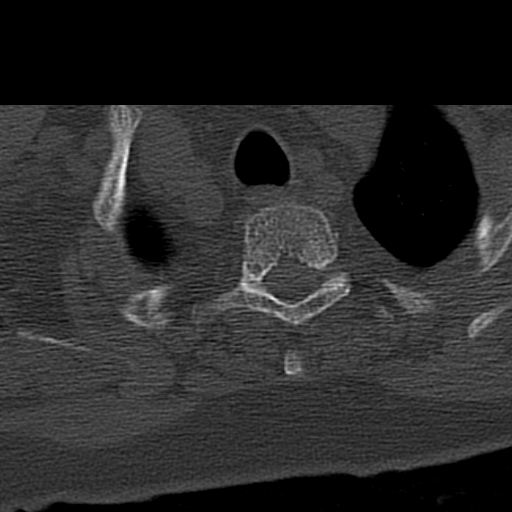
[im 20/87  brain]
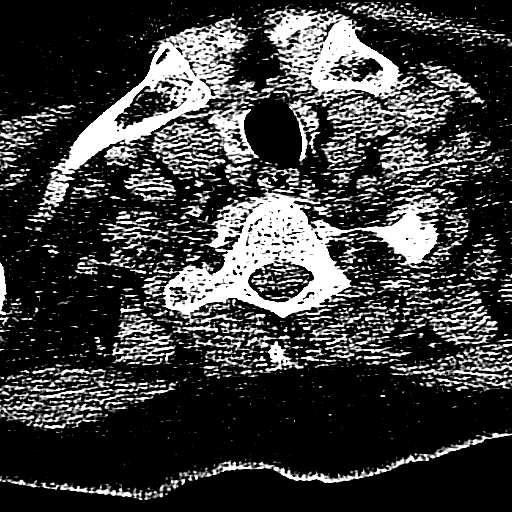
[im 29/87  brain]
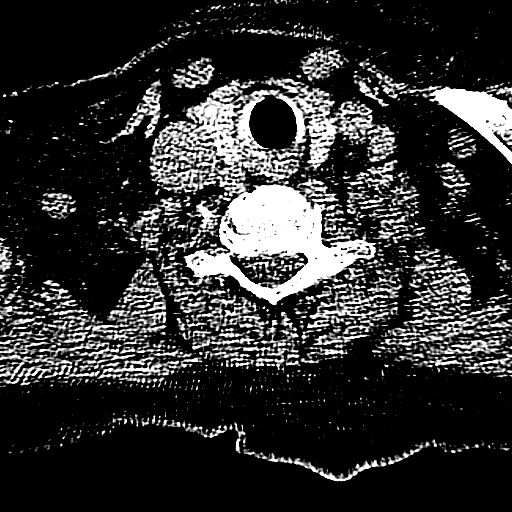
[im 39/87  brain]
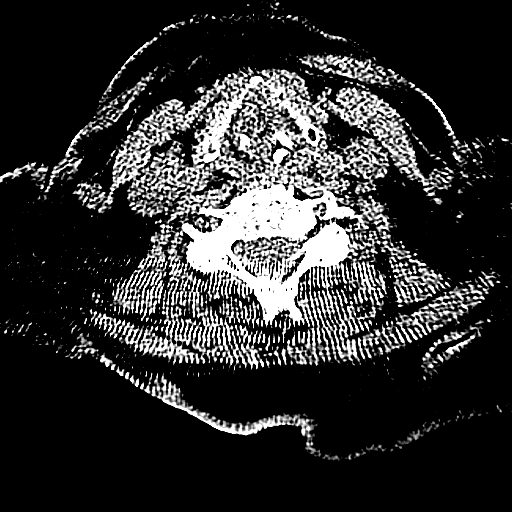
[im 48/87  brain]
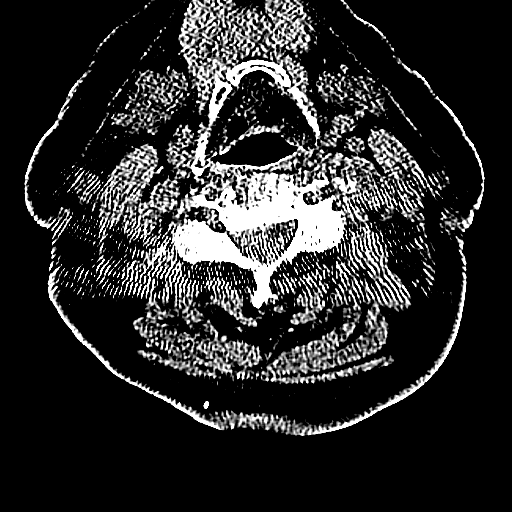
[im 48/87  bone]
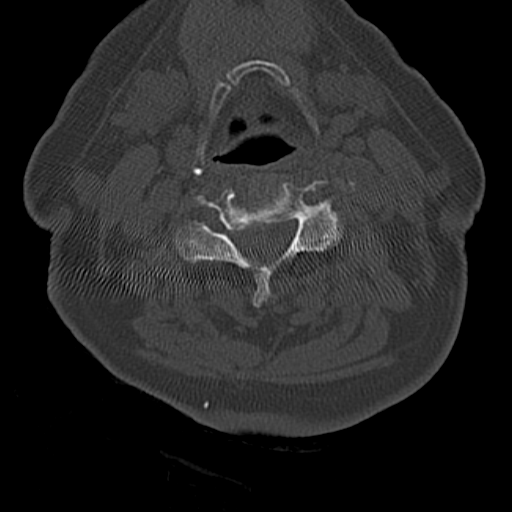
[im 58/87  brain]
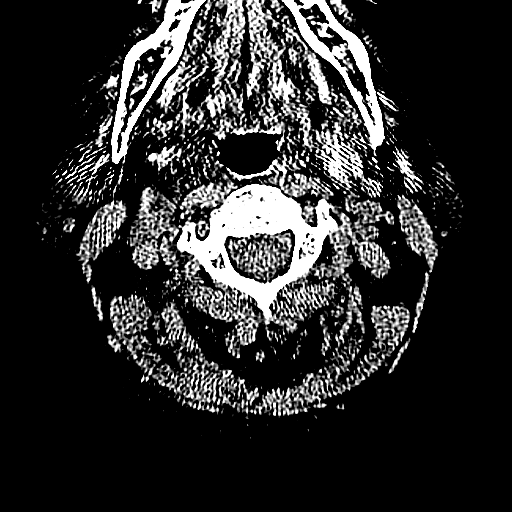
[im 67/87  brain]
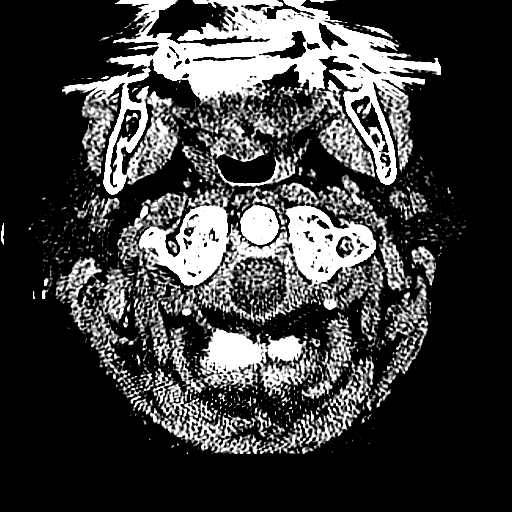
[im 77/87  brain]
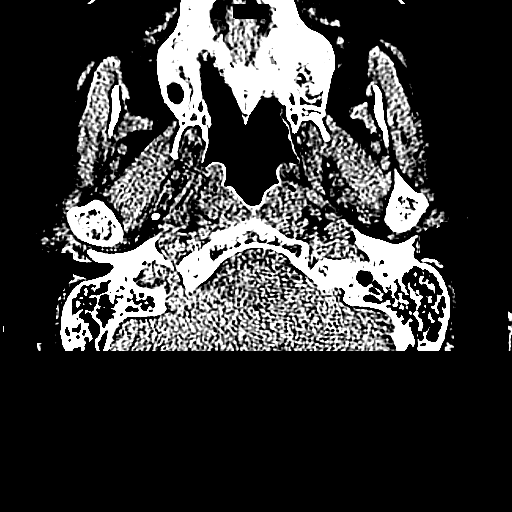

[16 of 47 positions shown; findings below may reference images not displayed]

FINDINGS: CT HEAD FINDINGS

No skull fracture or intracranial hemorrhage.

Small vessel disease type changes without CT evidence of large acute
infarct.

Atrophy. Ventricular prominence may be related to atrophy although
difficult to completely exclude a mild component of hydrocephalus.
Appearance unchanged.

No intracranial mass lesion noted on this unenhanced exam.

CT CERVICAL SPINE FINDINGS

No cervical spine fracture.

Congenital fusion C3-4.

Cervical spondylotic changes most notable C5-6 with mild cord
flattening. If ligamentous injury or cord injury were of high
clinical concern, MR imaging could be obtained for further
delineation.

No abnormal prevertebral soft tissue swelling.
IMPRESSION: CT HEAD:

No skull fracture or intracranial hemorrhage.

Small vessel disease type changes without CT evidence of large acute
infarct.

Atrophy. Ventricular prominence may be related to atrophy although
difficult to completely exclude a mild component of hydrocephalus.
Appearance unchanged.

CT CERVICAL SPINE:

No cervical spine fracture.

Cervical spondylotic changes most notable C5-6 with mild cord
flattening.

No abnormal prevertebral soft tissue swelling.

Please see above.

## 2015-03-17 IMAGING — CR DG HIP COMPLETE 2+V*L*
1 series · 3 of 3 positions shown · non-contrast
Comparison: 07/17/2013.

CLINICAL DATA: Left hip pain.  Recent fall.

EXAM:
LEFT HIP - COMPLETE 2+ VIEW

[Series 1: t pelvis ap · 0.14mm/px · 3 of 3 slices shown]
[im 1/3]
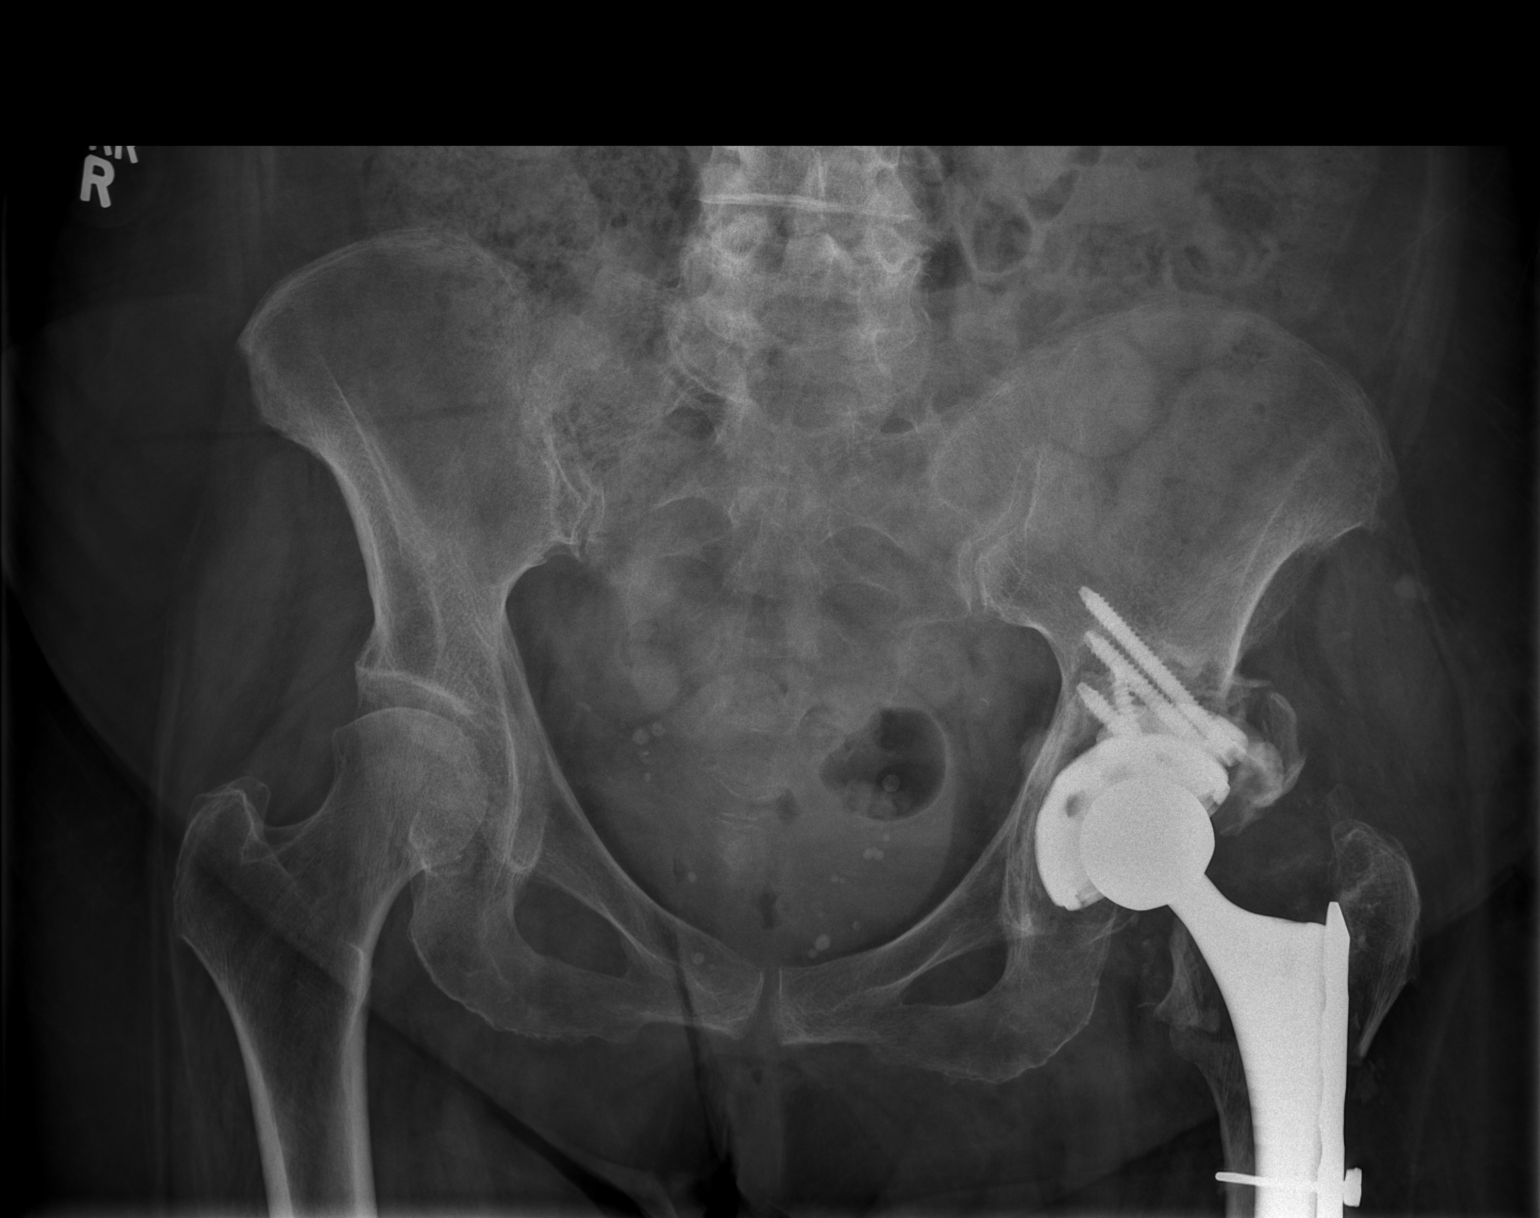
[im 2/3]
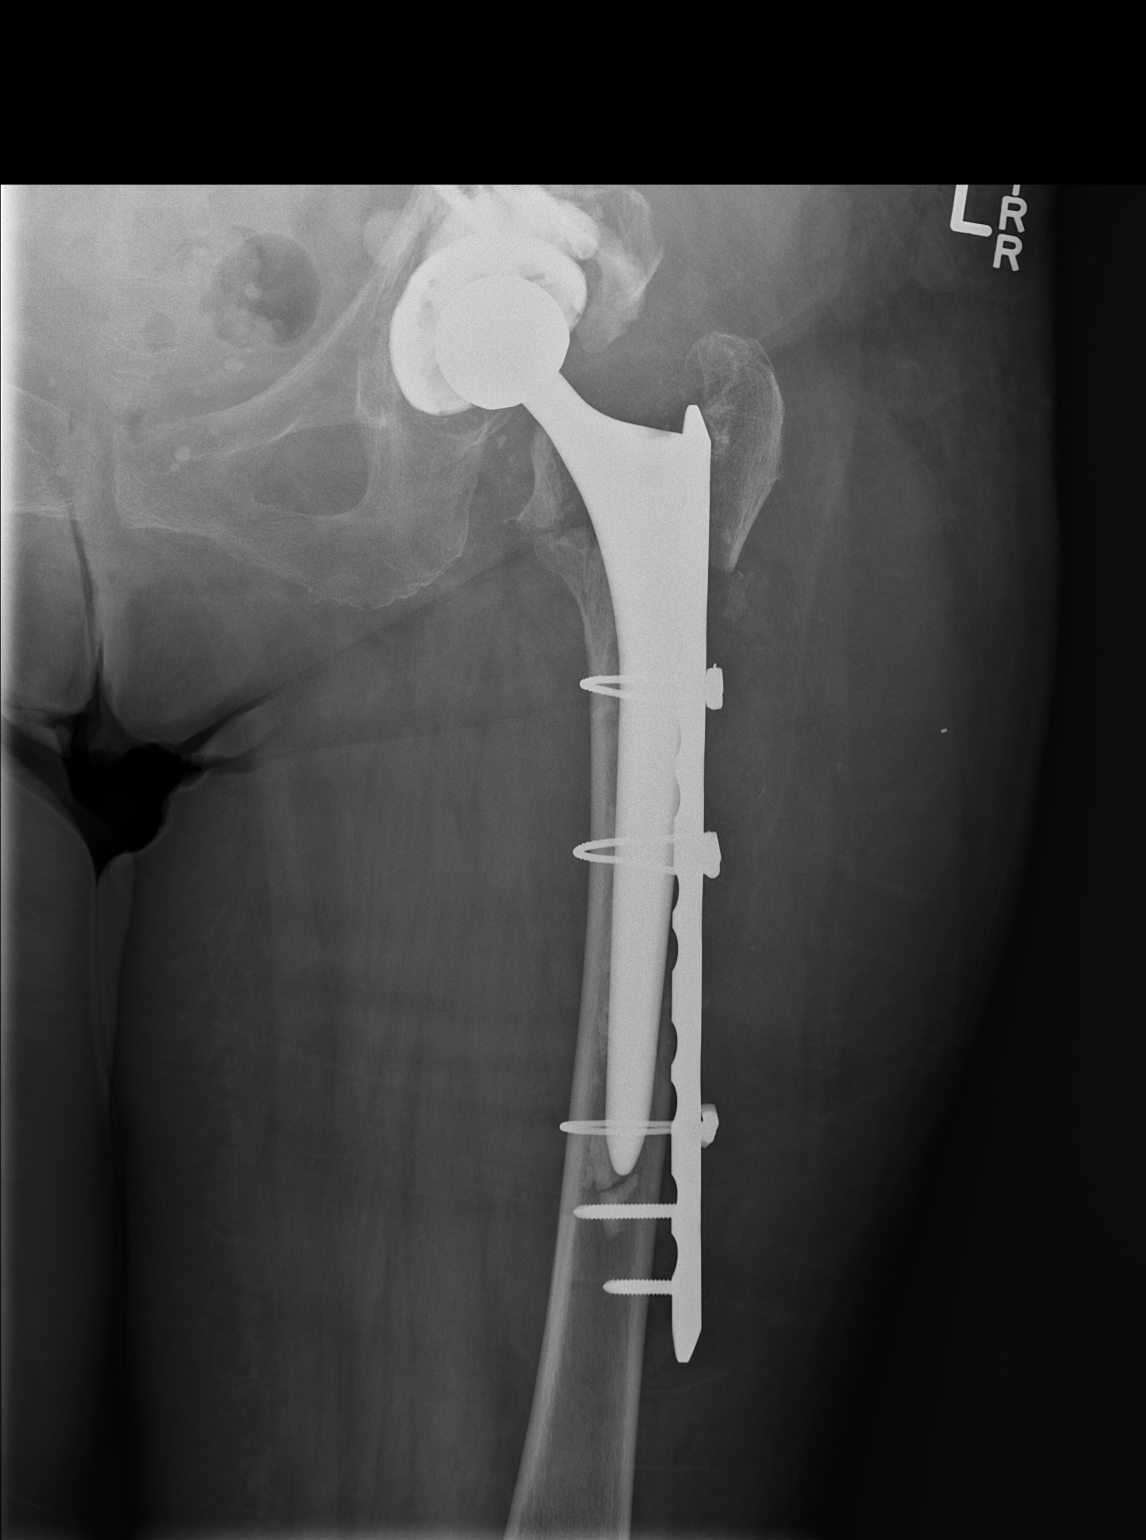
[im 3/3]
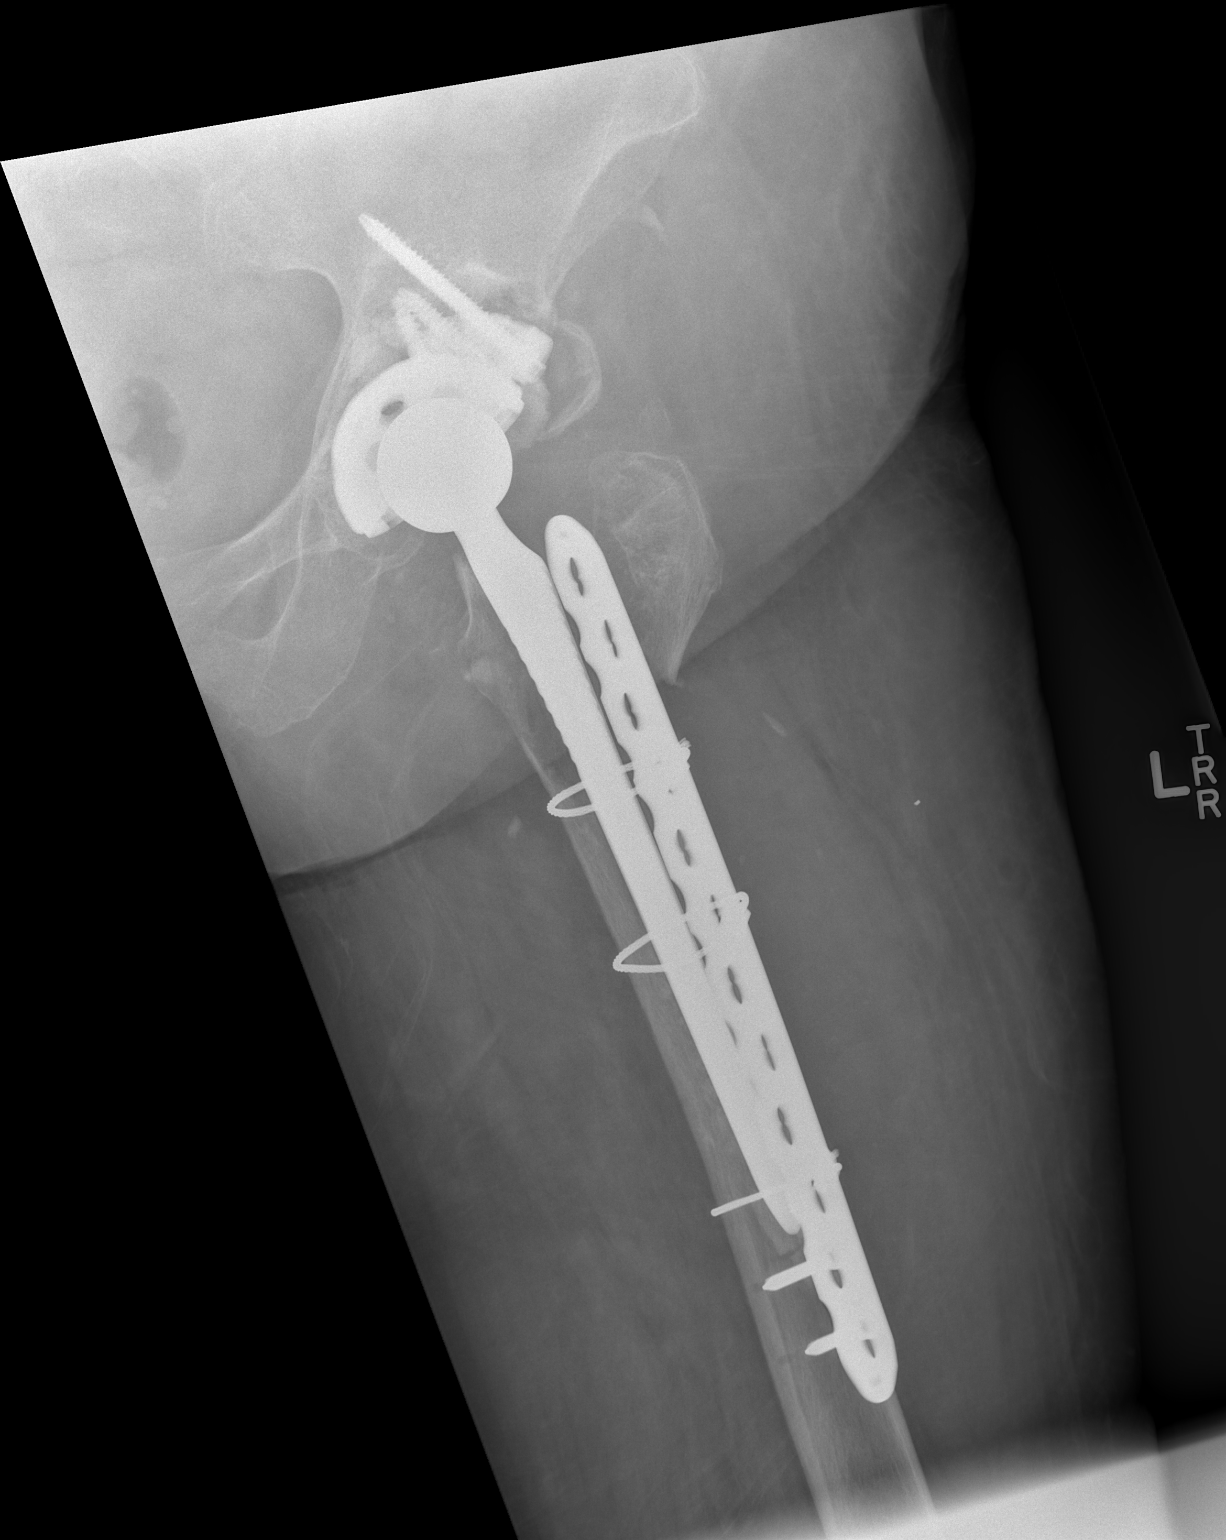

[3 of 3 positions shown; findings below may reference images not displayed]

FINDINGS: Status post revision hip arthroplasty. There is a lateral plate and
screw fixation device along the proximal femoral diaphysis. As noted
on the prior study, the distal aspect of the device is not opposed
to the bone, and the distal fixation screw appears to of back down
approximately 9 mm. Direct comparison with the prior examination is
challenging, as the project since obtained on today's study are
different than the prior study, however, this is likely unchanged.
Prosthetic femoral head remains located within the prosthetic
acetabulum. Healing intertrochanteric hip fracture. Remaining
portions of the bony pelvis and right proximal femur are otherwise
unremarkable.
IMPRESSION: 1. No significant interval change in the appearance of the left hip
following revision arthroplasty, as detailed above.

## 2015-05-09 ENCOUNTER — Encounter: Payer: Self-pay | Admitting: *Deleted

## 2015-05-09 ENCOUNTER — Other Ambulatory Visit: Payer: Self-pay | Admitting: *Deleted

## 2015-05-09 NOTE — Patient Outreach (Addendum)
[facility] Care Management  05/09/2015  Shawneen Deetz [birth year] [id number]  Subjective:  Telephone call to patient's 1st listed home number [phone]), message states this is a business number for Amedeo Plenty (patient's emergency contact), and no message left. Telephone call to patient's 2nd list home number [phone]), spoke with Alberteen Sam, states this is the wrong number. Telephone call to patient's 1st home number [phone]), spoke with Amedeo Plenty, states she is patient's daughter and POA (power of attorney).  States patient is unavailable, currently resides in an Assisting Living facility, and has dementia.  RNCM advised daughter unable to disclose nature of call due to HIPAA and daughter in agreement to fax RNCM a copy of power of attorney.   Daughter states she will try today and fax a copy of power of attorney to [facility] .   Telephone call from Damita Rhodie at [facility] Management, states fax from patient's daughter is only the cover page and full 10 page document has not been received.   Patient's daughter has attempted to fax document 3 times.  RNCM advised will call patient's to daughter to inform of fax receipt issue. Telephone call to patient's daughter, states she is aware of the fax issue and will continue to try to send 10 page document.    Objective: Per Epic case review, no progress notes listed for 2016.   Per patient's demographics, home address is [address].  Google search address is [address] [place], [place] [address], [phone].    Assessment: Received [facility] HMO Tier 4 list referral on 04/28/15.   No diagnosis listed.   2 ED visits and 0 admits.  Telephone screening pending patient contact and/ or  verification of POA.   Plan:  RNCM will send request to Damita Rhodie at [facility] Care Management to remove 2nd home number ([phone]) from patient's contact information in Epic. RNCM will send unsuccessful outreach letter to  patient's home address.   RNCM will proceed with case closure, if no power of attorney (POA) forms received within 10 business days.  Javohn Basey H. Gardiner Barefoot, BSN, CCM [facility] Care Management [facility] Telephonic CM Phone: [phone] Fax: [phone]

## 2015-05-14 IMAGING — CR DG FEMUR 2V*L*
1 series · 4 of 4 positions shown · non-contrast
Comparison: 08/02/2013

CLINICAL DATA: Fall today with left femur pain.

EXAM:
LEFT FEMUR - 2 VIEW

[Series 1: t femur proximal ap left · 0.14mm/px · 4 of 4 slices shown]
[im 1/4]
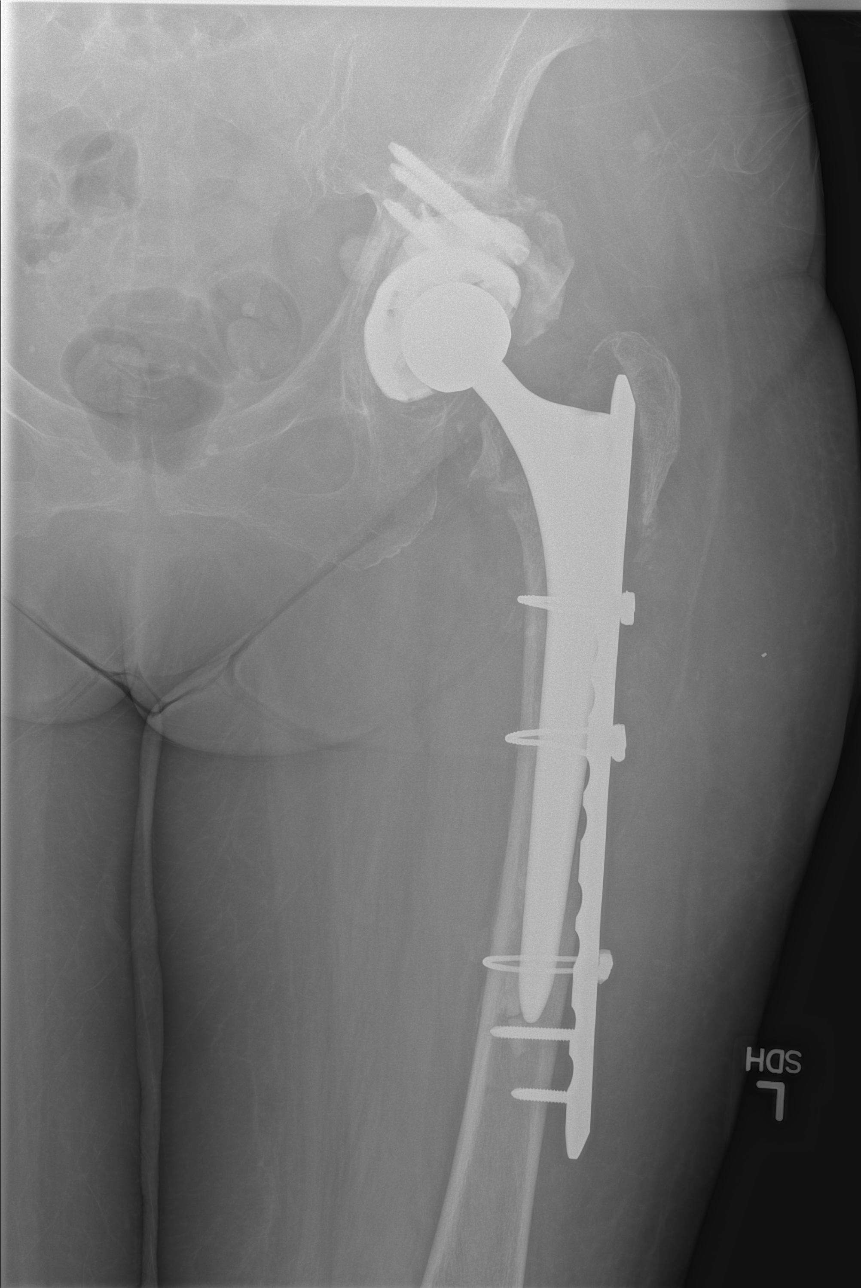
[im 2/4]
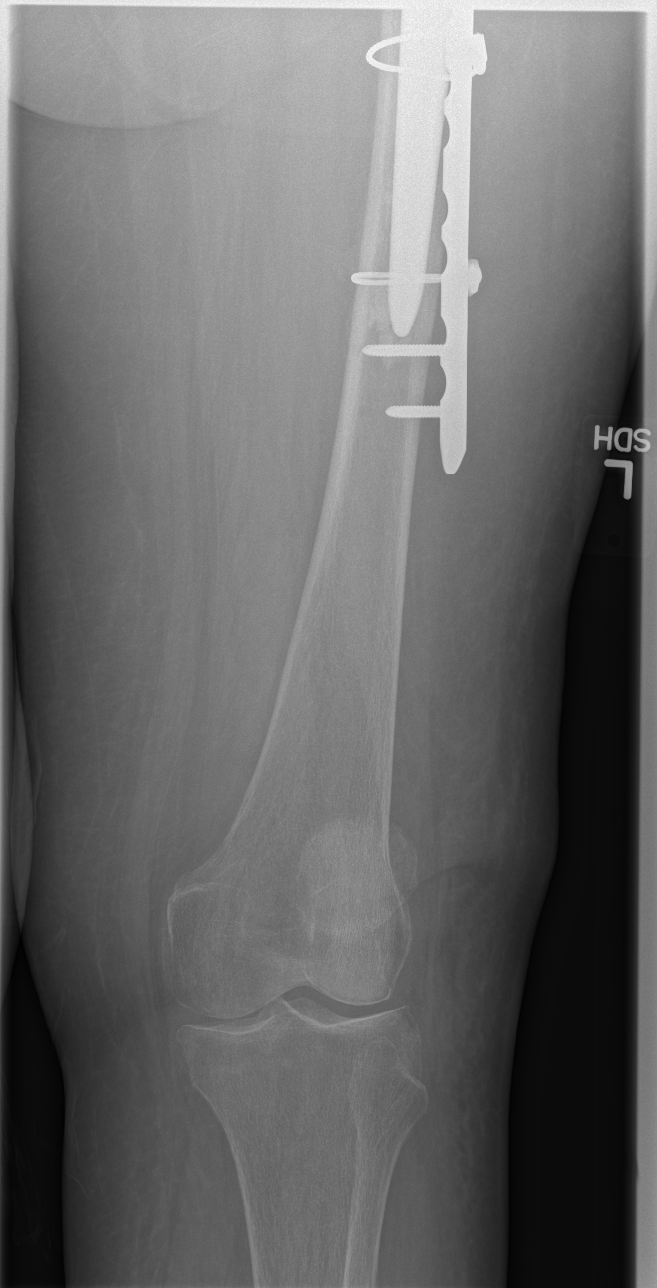
[im 3/4]
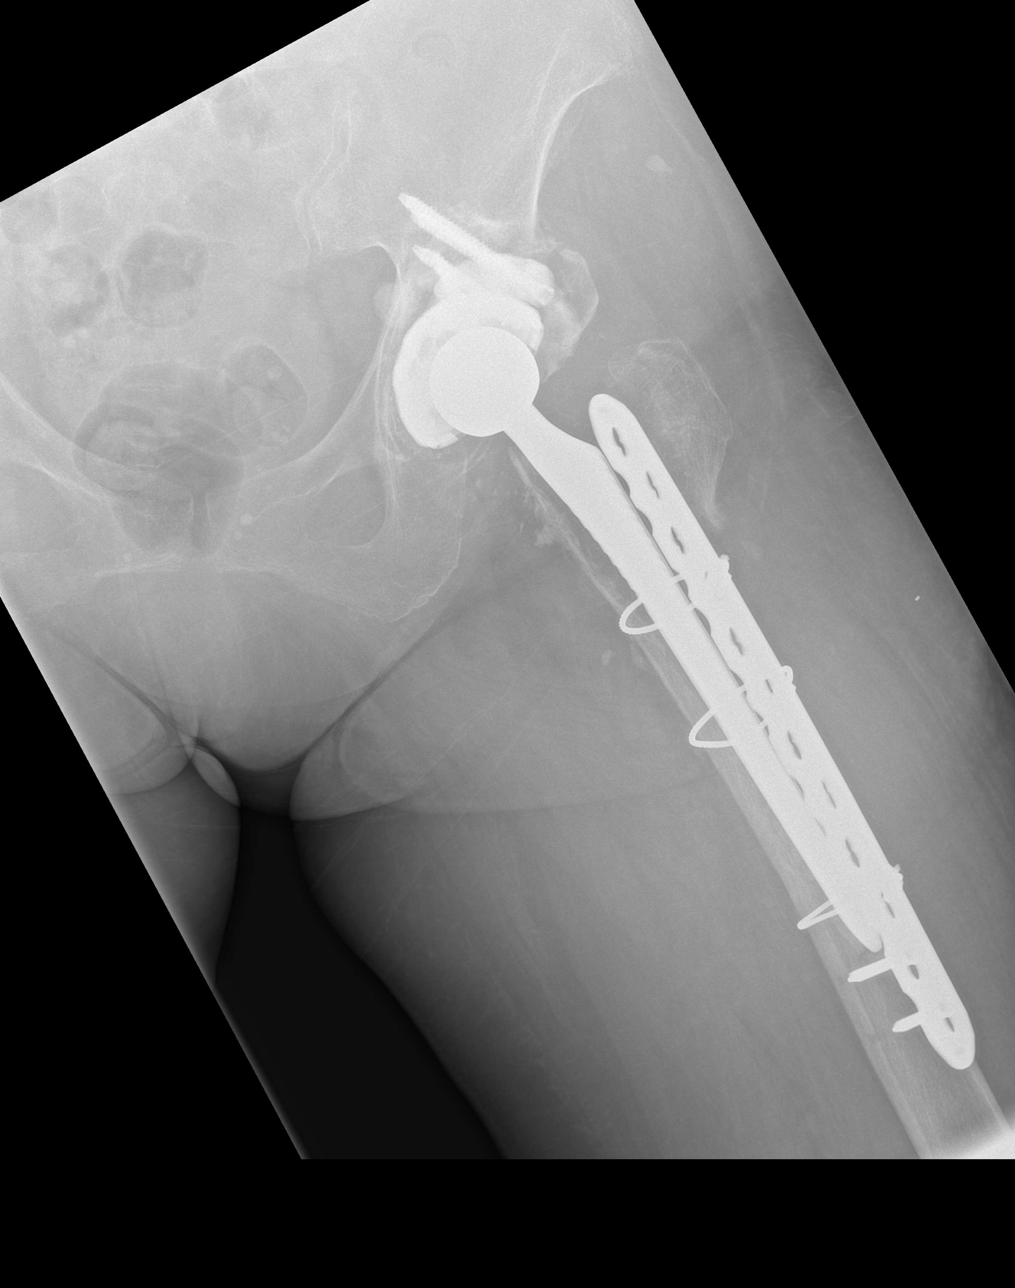
[im 4/4]
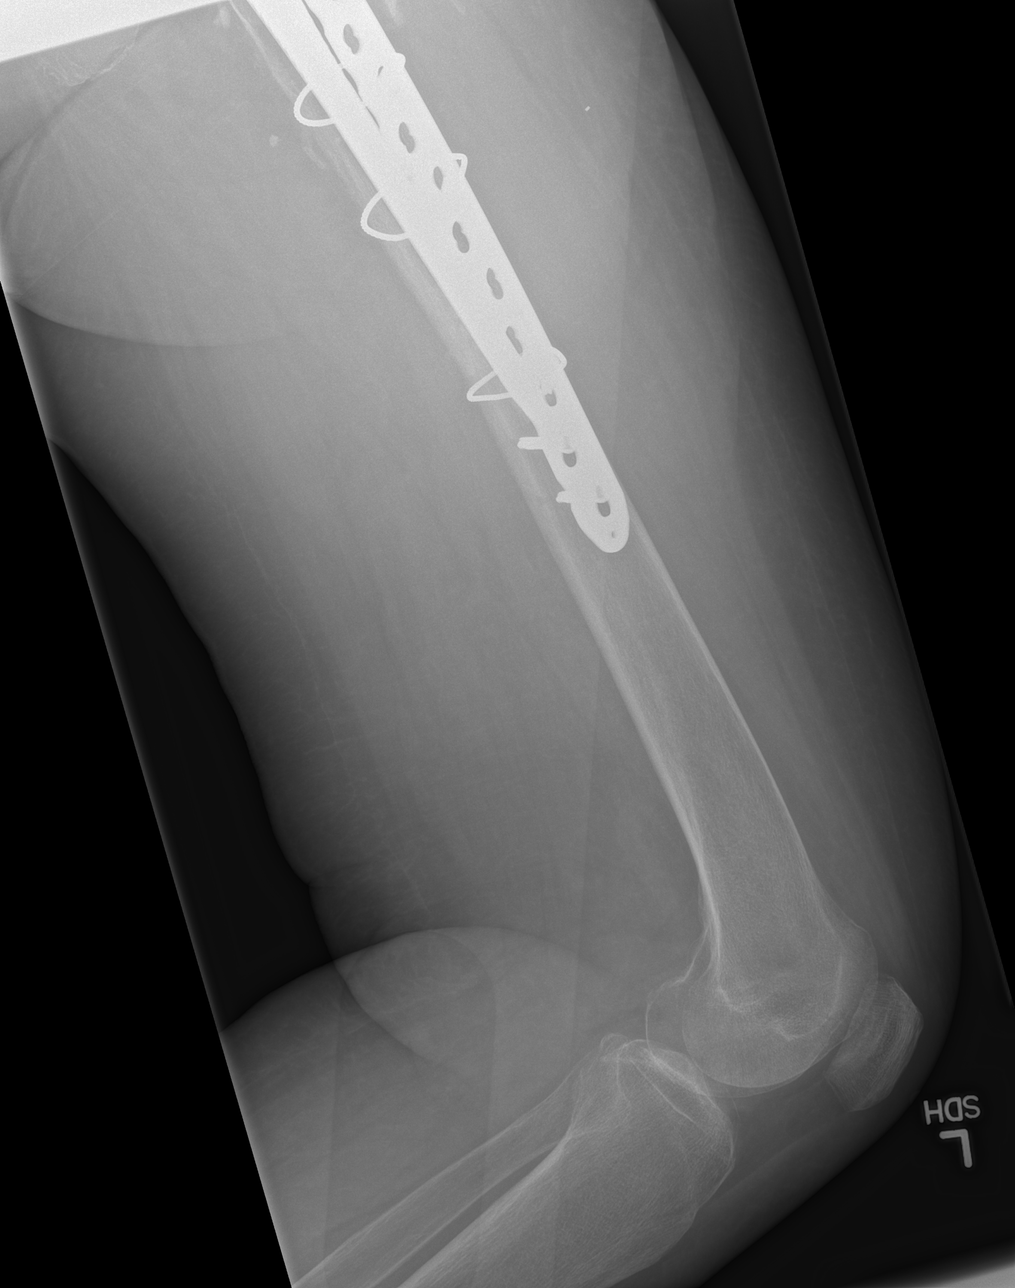

[4 of 4 positions shown; findings below may reference images not displayed]

FINDINGS: Osteopenia. Status post revision left hip arthroplasty. Similar
appearance of the acetabular component and the long-stem femoral
component. Plate and screw fixation of the femoral shaft. Similar
appearance of the fixation plate being positioned lateral to the
femoral shaft. Lucency secondary to the distal screw being
retracted. This is similar in configuration to on the prior. No new
abnormality identified.
IMPRESSION: Similar appearance of a long-stem revision left hip arthroplasty
with in the lateral plate again appearing displaced laterally with
suggestion of retraction of the distal screw.

## 2015-05-23 ENCOUNTER — Encounter: Payer: Self-pay | Admitting: *Deleted

## 2015-05-23 ENCOUNTER — Other Ambulatory Visit: Payer: Self-pay | Admitting: *Deleted

## 2015-05-23 NOTE — Patient Outreach (Signed)
[facility] Care Management  05/23/2015  Kath[facility]essMary [facility]illo [facility] 1931-08-25 [id number]  POA documents not received and unable to establish contact with patient.  Objective: Per Epic case review, no progress notes listed for 2016. Per patient's demographics, home address is [address]. Google search address is [address], [place] [id number], [phone].   Assessment: Received [facility] H[facility] Tier 4 list referral on 04/28/15. No diagnosis listed. 2 ED visits and 0 admits. Unable to verify POA status and unable to reach patient, will proceed with case closure.   Plan: RNCM will send case closure letter due to unable to contact patient, to patient's primary MD. [facility] will send case closure request to Damita [facility]ie at [facility].    Amulya Quintin H. Gardiner Barefootooper RN, BSN, CCM [facility] Care Management [facility] Telephonic CM Phone: [phone] Fax: [phone]

## 2015-09-15 ENCOUNTER — Emergency Department
Admission: EM | Admit: 2015-09-15 | Discharge: 2015-09-15 | Disposition: A | Discharge status: Home / Self Care | Payer: Medicare HMO | Attending: Emergency Medicine | Admitting: Emergency Medicine

## 2015-09-15 ENCOUNTER — Other Ambulatory Visit: Payer: Self-pay

## 2015-09-15 ENCOUNTER — Encounter: Payer: Self-pay | Admitting: Emergency Medicine

## 2015-09-15 ENCOUNTER — Emergency Department: Payer: Medicare HMO

## 2015-09-15 DIAGNOSIS — Y999 Unspecified external cause status: Secondary | ICD-10-CM | POA: Insufficient documentation

## 2015-09-15 DIAGNOSIS — Z79899 Other long term (current) drug therapy: Secondary | ICD-10-CM | POA: Insufficient documentation

## 2015-09-15 DIAGNOSIS — R51 Headache: Secondary | ICD-10-CM | POA: Insufficient documentation

## 2015-09-15 DIAGNOSIS — Y929 Unspecified place or not applicable: Secondary | ICD-10-CM | POA: Diagnosis not present

## 2015-09-15 DIAGNOSIS — W19XXXA Unspecified fall, initial encounter: Secondary | ICD-10-CM | POA: Diagnosis not present

## 2015-09-15 DIAGNOSIS — S2232XA Fracture of one rib, left side, initial encounter for closed fracture: Secondary | ICD-10-CM | POA: Insufficient documentation

## 2015-09-15 DIAGNOSIS — Y939 Activity, unspecified: Secondary | ICD-10-CM | POA: Insufficient documentation

## 2015-09-15 DIAGNOSIS — S299XXA Unspecified injury of thorax, initial encounter: Secondary | ICD-10-CM | POA: Diagnosis present

## 2015-09-15 HISTORY — DX: Reserved for concepts with insufficient information to code with codable children: IMO0002

## 2015-09-15 HISTORY — DX: Unspecified dementia, unspecified severity, without behavioral disturbance, psychotic disturbance, mood disturbance, and anxiety: F03.90

## 2015-09-15 HISTORY — DX: Generalized anxiety disorder: F41.1

## 2015-09-15 HISTORY — DX: Insomnia, unspecified: G47.00

## 2015-09-15 MED ORDER — LORAZEPAM 2 MG/ML IJ SOLN
INTRAMUSCULAR | Status: AC
  Filled 2015-09-15: qty 1

## 2015-09-15 NOTE — ED Notes (Signed)
Awake and alert. NAD 

## 2015-09-15 NOTE — ED Notes (Signed)
Helped pt. Use bathroom in room.  Pt. Ambulated with no difficulty.

## 2015-09-15 NOTE — Discharge Instructions (Signed)
Your x-rays are unclear, but may suggest a very small and nondisplaced left rib fracture. Return to the ER for worsening symptoms, persistent vomiting, lethargy, fever, difficulty breathing or other concerns.  Fall Prevention in Hospitals, Adult As a hospital patient, your condition and the treatments you receive can increase your risk for falls. Some additional risk factors for falls in a hospital include:  Being in an unfamiliar environment.  Being on bed rest.  Your surgery.  Taking certain medicines.  Your tubing requirements, such as intravenous (IV) therapy or catheters. It is important that you learn how to decrease fall risks while at the hospital. Below are important tips that can help prevent falls. SAFETY TIPS FOR PREVENTING FALLS Talk about your risk of falling.  Ask your health care provider why you are at risk for falling. Is it your medicine, illness, tubing placement, or something else?  Make a plan with your health care provider to keep you safe from falls.  Ask your health care provider or pharmacist about side effects of your medicines. Some medicines can make you dizzy or affect your coordination. Ask for help.  Ask for help before getting out of bed. You may need to press your call button.  Ask for assistance in getting safely to the toilet.  Ask for a walker or cane to be put at your bedside. Ask that most of the side rails on your bed be placed up before your health care provider leaves the room.  Ask family or friends to sit with you.  Ask for things that are out of your reach, such as your glasses, hearing aids, telephone, bedside table, or call button. Follow these tips to avoid falling:  Stay lying or seated, rather than standing, while waiting for help.  Wear rubber-soled slippers or shoes whenever you walk in the hospital.  Avoid quick, sudden movements.  Change positions slowly.  Sit on the side of your bed before standing.  Stand up slowly  and wait before you start to walk.  Let your health care provider know if there is a spill on the floor.  Pay careful attention to the medical equipment, electrical cords, and tubes around you.  When you need help, use your call button by your bed or in the bathroom. Wait for one of your health care providers to help you.  If you feel dizzy or unsure of your footing, return to bed and wait for assistance.  Avoid being distracted by the TV, telephone, or another person in your room.  Do not lean or support yourself on rolling objects, such as IV poles or bedside tables.   This information is not intended to replace advice given to you by your health care provider. Make sure you discuss any questions you have with your health care provider.   Document Released: 02/27/2000 Document Revised: 03/22/2014 Document Reviewed: 11/07/2011 Elsevier Interactive Patient Education 2016 Elsevier Inc.  Rib Fracture A rib fracture is a break or crack in one of the bones of the ribs. The ribs are a group of long, curved bones that wrap around your chest and attach to your spine. They protect your lungs and other organs in the chest cavity. A broken or cracked rib is often painful, but most do not cause other problems. Most rib fractures heal on their own over time. However, rib fractures can be more serious if multiple ribs are broken or if broken ribs move out of place and push against other structures. CAUSES  A direct blow to the chest. For example, this could happen during contact sports, a car accident, or a fall against a hard object.  Repetitive movements with high force, such as pitching a baseball or having severe coughing spells. SYMPTOMS   Pain when you breathe in or cough.  Pain when someone presses on the injured area. DIAGNOSIS  Your caregiver will perform a physical exam. Various imaging tests may be ordered to confirm the diagnosis and to look for related injuries. These tests may  include a chest X-ray, computed tomography (CT), magnetic resonance imaging (MRI), or a bone scan. TREATMENT  Rib fractures usually heal on their own in 1-3 months. The longer healing period is often associated with a continued cough or other aggravating activities. During the healing period, pain control is very important. Medication is usually given to control pain. Hospitalization or surgery may be needed for more severe injuries, such as those in which multiple ribs are broken or the ribs have moved out of place.  HOME CARE INSTRUCTIONS   Avoid strenuous activity and any activities or movements that cause pain. Be careful during activities and avoid bumping the injured rib.  Gradually increase activity as directed by your caregiver.  Only take over-the-counter or prescription medications as directed by your caregiver. Do not take other medications without asking your caregiver first.  Apply ice to the injured area for the first 1-2 days after you have been treated or as directed by your caregiver. Applying ice helps to reduce inflammation and pain.  Put ice in a plastic bag.  Place a towel between your skin and the bag.   Leave the ice on for 15-20 minutes at a time, every 2 hours while you are awake.  Perform deep breathing as directed by your caregiver. This will help prevent pneumonia, which is a common complication of a broken rib. Your caregiver may instruct you to:  Take deep breaths several times a day.  Try to cough several times a day, holding a pillow against the injured area.  Use a device called an incentive spirometer to practice deep breathing several times a day.  Drink enough fluids to keep your urine clear or pale yellow. This will help you avoid constipation.   Do not wear a rib belt or binder. These restrict breathing, which can lead to pneumonia.  SEEK IMMEDIATE MEDICAL CARE IF:   You have a fever.   You have difficulty breathing or shortness of breath.    You develop a continual cough, or you cough up thick or bloody sputum.  You feel sick to your stomach (nausea), throw up (vomit), or have abdominal pain.   You have worsening pain not controlled with medications.  MAKE SURE YOU:  Understand these instructions.  Will watch your condition.  Will get help right away if you are not doing well or get worse.   This information is not intended to replace advice given to you by your health care provider. Make sure you discuss any questions you have with your health care provider.   Document Released: 03/01/2005 Document Revised: 11/01/2012 Document Reviewed: 05/03/2012 Elsevier Interactive Patient Education [employer]2016 Elsevier Inc.

## 2015-09-15 NOTE — ED Notes (Signed)
Pt. States she got dizzy and fell hurting her lt. Upper ribs.  Pt. States previous pain to this area, hurt worse after unwitnessed fall.  Pt. Unsure if she hit her head.  No obvious injuries noted.

## 2015-09-15 NOTE — ED Notes (Signed)
Pt. Transported back to [facility] via [facility], report given to [facility].

## 2015-09-15 NOTE — ED Provider Notes (Signed)
[facility] Emergency Department Provider Note   ____________________________________________  Time seen: Approximately 5:01 AM  I have reviewed the triage vital signs and the nursing notes.   HISTORY  Chief Complaint Fall  Limited by dementia  HPI [name] is a 82 y.o. female who presents to the ED from [place] [facility] via EMS for unwitnessed fall. Patient does not recall how she fell. Denies striking head or LOC. Notes left-sided upper rib pain which she has been experiencing for several months. Denies pain currently. Denies recent fever, chills, chest pain, shortness of breath, abdominal pain, nausea, vomiting, diarrhea, dysuria. Nothing makes her symptoms better or worse.No use of anticoagulants   Past Medical History  Diagnosis Date  . Dementia   . 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 Squamous cell carcinoma (HCC)   . GAD (generalized anxiety disorder)     There are no active problems to display for this patient.   History reviewed. No pertinent past surgical history.  Current Outpatient Rx  Name  Route  Sig  Dispense  Refill  . acetaminophen (TYLENOL) 325 MG tablet   Oral   Take 650 mg by mouth 3 (three) times daily.         Marland Kitchen. acetaminophen (TYLENOL) 500 MG tablet   Oral   Take 500 mg by mouth every 4 (four) hours as needed for mild pain, fever or headache.         Marland Kitchen. alum & mag hydroxide-simeth (MINTOX) 200-200-20 MG/5ML suspension   Oral   Take 30 mLs by mouth every 6 (six) hours as needed for indigestion or heartburn.         Marland Kitchen. amLODipine (NORVASC) 5 MG tablet   Oral   Take 5 mg by mouth daily.         Marland Kitchen. atorvastatin (LIPITOR) 40 MG tablet   Oral   Take 40 mg by mouth daily.         . carvedilol (COREG) 6.25 MG tablet   Oral   Take 6.25 mg by mouth 2 (two) times daily with a meal.         . Cholecalciferol (VITAMIN D) 2000 units CAPS   Oral   Take 2,000 Units by mouth  daily.         . divalproex (DEPAKOTE SPRINKLE) 125 MG capsule   Oral   Take 250 mg by mouth 3 (three) times daily.         Marland Kitchen. donepezil (ARICEPT) 10 MG tablet   Oral   Take 10 mg by mouth at bedtime.         Marland Kitchen. guaifenesin (ROBITUSSIN) 100 MG/5ML syrup   Oral   Take 200 mg by mouth 4 (four) times daily as needed for cough.         . loperamide (IMODIUM) 2 MG capsule   Oral   Take 2 mg by mouth as needed for diarrhea or loose stools.         . magnesium hydroxide (MILK OF MAGNESIA) 400 MG/5ML suspension   Oral   Take 30 mLs by mouth at bedtime as needed for mild constipation.         . magnesium oxide (MAG-OX) 400 MG tablet   Oral   Take 400 mg by mouth daily.         . methocarbamol (ROBAXIN) 500 MG tablet   Oral   Take 500 mg by mouth every 6 (six) hours as needed for  muscle spasms.         . montelukast (SINGULAIR) 10 MG tablet   Oral   Take 10 mg by mouth at bedtime.         Marland Kitchen. neomycin-bacitracin-polymyxin (NEOSPORIN) ointment   Topical   Apply 1 application topically as needed for wound care. apply to eye         . omeprazole (PRILOSEC) 20 MG capsule   Oral   Take 20 mg by mouth daily.         . polyethylene glycol (MIRALAX / GLYCOLAX) packet   Oral   Take 17 g by mouth daily.         . polyvinyl alcohol (LIQUIFILM TEARS) 1.4 % ophthalmic solution   Both Eyes   Place 1 drop into both eyes 4 (four) times daily.         . QUEtiapine (SEROQUEL) 25 MG tablet   Oral   Take 25 mg by mouth daily.         . QUEtiapine (SEROQUEL) 50 MG tablet   Oral   Take 50 mg by mouth at bedtime.         . Skin Protectants, Misc. (DIMETHICONE-ZINC OXIDE) cream   Topical   Apply 1 application topically 2 (two) times daily as needed (redness/ open wounds).         . traZODone (DESYREL) 50 MG tablet   Oral   Take 25 mg by mouth at bedtime.           Allergies Review of patient's allergies indicates no known allergies.  History  reviewed. No pertinent family history.  Social History Social History  Substance Use Topics  . Smoking status: Never Smoker   . Smokeless tobacco: None  . Alcohol Use: No    Review of Systems  Constitutional: No fever/chills. Eyes: No visual changes. ENT: No sore throat. Cardiovascular: Denies chest pain. Respiratory: Positive for left-sided rib pain for several months. Denies shortness of breath. Gastrointestinal: No abdominal pain.  No nausea, no vomiting.  No diarrhea.  No constipation. Genitourinary: Negative for dysuria. Musculoskeletal: Negative for back pain. Skin: Negative for rash. Neurological: Negative for headaches, focal weakness or numbness.  10-point ROS otherwise negative.  ____________________________________________   PHYSICAL EXAM:  VITAL SIGNS: ED Triage Vitals  Enc Vitals Group     BP 09/15/15 0353 128/68 mmHg     Pulse Rate 09/15/15 0353 69     Resp 09/15/15 0353 13     Temp 09/15/15 0353 98 F (36.7 C)     Temp src --      SpO2 09/15/15 0353 95 %     Weight 09/15/15 0353 175 lb (79.379 kg)     Height 09/15/15 0353 5\' 2"  (1.575 m)     Head Cir --      Peak Flow --      Pain Score 09/15/15 0356 6     Pain Loc --      Pain Edu? --      Excl. in GC? --     Constitutional: Alert and oriented. Well appearing and in no acute distress. Eyes: Conjunctivae are normal. PERRL. EOMI. Head: Atraumatic. Nose: No congestion/rhinnorhea. Mouth/Throat: Mucous membranes are moist.  Oropharynx non-erythematous. Neck: No stridor.  No cervical spine tenderness to palpation. Cardiovascular: Normal rate, regular rhythm. Grossly normal heart sounds.  Good peripheral circulation. Respiratory: Normal respiratory effort.  No retractions. Lungs CTAB. No rib tenderness to palpation. Gastrointestinal: Soft and nontender. No distention. No abdominal  bruits. No CVA tenderness. Musculoskeletal: Pelvis stable. Hips nontender to palpation. No lower extremity tenderness  nor edema.  No joint effusions. Neurologic:  Alert and oriented 2. Normal speech and language. No gross focal neurologic deficits are appreciated.  Skin:  Skin is warm, dry and intact. No rash noted. Psychiatric: Mood and affect are normal. Speech and behavior are normal.  ____________________________________________   LABS (all labs ordered are listed, but only abnormal results are displayed)  Labs Reviewed  POCT CBG (FASTING - GLUCOSE)-MANUAL ENTRY   ____________________________________________  EKG  ED ECG REPORT I, Vivian Okelley J, the attending physician, personally viewed and interpreted this ECG.   Date: 09/15/2015  EKG Time: 0353  Rate: 70  Rhythm: normal EKG, normal sinus rhythm  Axis: Normal  Intervals:none  ST&T Change: Nonspecific  ____________________________________________  RADIOLOGY  Chest with rib x-rays (viewed by me, interpreted per Dr. Cherly Hensen): Question of a mildly displaced fracture of the left anterolateral ninth rib. No acute cardiopulmonary process identified.  CT head without contrast interpreted per Dr. Cherly Hensen: 1. No acute intracranial pathology seen on CT. 2. Moderate cortical volume loss and scattered small vessel ischemic microangiopathy. ____________________________________________   PROCEDURES  Procedure(s) performed: None  Critical Care performed: No  ____________________________________________   INITIAL IMPRESSION / ASSESSMENT AND PLAN / ED COURSE  Pertinent labs & imaging results that were available during my care of the patient were reviewed by me and considered in my medical decision making (see chart for details).  82 year old female who presents with unwitnessed fall from nursing facility. Will obtain CT head and plain film x-rays of chest and left ribs.  ----------------------------------------- 5:25 AM on 09/15/2015 -----------------------------------------  Updated patient on imaging results. She is resting  comfortably, room air saturations 98%. Strict return precautions given. Patient verbalizes understanding and agrees with plan of care. ____________________________________________   FINAL CLINICAL IMPRESSION(S) / ED DIAGNOSES  Final diagnoses:  Fall, initial encounter  Closed rib fracture, left, initial encounter      NEW MEDICATIONS STARTED DURING THIS VISIT:  New Prescriptions   No medications on file     Note:  This document was prepared using Dragon voice recognition software and may include unintentional dictation errors.    Irean Hong, MD 09/15/15 [phone]

## 2016-08-12 ENCOUNTER — Emergency Department: Payer: Medicare HMO

## 2016-08-12 ENCOUNTER — Encounter: Payer: Self-pay | Admitting: Emergency Medicine

## 2016-08-12 ENCOUNTER — Emergency Department: Admission: EM | Admit: 2016-08-12 | Payer: Medicare HMO | Source: Home / Self Care

## 2016-08-12 NOTE — ED Triage Notes (Addendum)
Presents to ED to be evaluated for unwitnessed fall. Pt denies pain and obvious injury noted. Pt from [facility].

## 2017-04-06 ENCOUNTER — Other Ambulatory Visit: Payer: Self-pay

## 2017-04-06 ENCOUNTER — Encounter: Payer: Self-pay | Admitting: Emergency Medicine

## 2017-04-06 ENCOUNTER — Emergency Department
Admission: EM | Admit: 2017-04-06 | Discharge: 2017-04-06 | Disposition: A | Discharge status: Home / Self Care | Payer: Medicare HMO | Attending: Emergency Medicine | Admitting: Emergency Medicine

## 2017-04-06 ENCOUNTER — Emergency Department: Payer: Medicare HMO

## 2017-04-06 DIAGNOSIS — Z85828 Personal history of other malignant neoplasm of skin: Secondary | ICD-10-CM | POA: Diagnosis not present

## 2017-04-06 DIAGNOSIS — F0390 Unspecified dementia without behavioral disturbance: Secondary | ICD-10-CM | POA: Insufficient documentation

## 2017-04-06 DIAGNOSIS — N3001 Acute cystitis with hematuria: Secondary | ICD-10-CM

## 2017-04-06 DIAGNOSIS — J189 Pneumonia, unspecified organism: Secondary | ICD-10-CM | POA: Diagnosis not present

## 2017-04-06 DIAGNOSIS — Z79899 Other long term (current) drug therapy: Secondary | ICD-10-CM | POA: Insufficient documentation

## 2017-04-06 DIAGNOSIS — R509 Fever, unspecified: Secondary | ICD-10-CM | POA: Diagnosis present

## 2017-04-06 LAB — CBC WITH DIFFERENTIAL/PLATELET
BASOS ABS: 0 10*3/uL (ref 0–0.1)
Basophils Relative: 1 %
EOS ABS: 0 10*3/uL (ref 0–0.7)
Eosinophils Relative: 0 %
HCT: 35.6 % (ref 35.0–47.0)
HEMOGLOBIN: 12.1 g/dL (ref 12.0–16.0)
LYMPHS PCT: 11 %
Lymphs Abs: 1 10*3/uL (ref 1.0–3.6)
MCH: 31.7 pg (ref 26.0–34.0)
MCHC: 33.9 g/dL (ref 32.0–36.0)
MCV: 93.7 fL (ref 80.0–100.0)
Monocytes Absolute: 0.7 10*3/uL (ref 0.2–0.9)
Monocytes Relative: 8 %
NEUTROS PCT: 80 %
Neutro Abs: 7.3 10*3/uL — ABNORMAL HIGH (ref 1.4–6.5)
Platelets: 201 10*3/uL (ref 150–440)
RBC: 3.8 MIL/uL (ref 3.80–5.20)
RDW: 14.2 % (ref 11.5–14.5)
WBC: 9.1 10*3/uL (ref 3.6–11.0)

## 2017-04-06 LAB — COMPREHENSIVE METABOLIC PANEL
ALT: 10 U/L — ABNORMAL LOW (ref 14–54)
AST: 14 U/L — ABNORMAL LOW (ref 15–41)
Albumin: 3.3 g/dL — ABNORMAL LOW (ref 3.5–5.0)
Alkaline Phosphatase: 106 U/L (ref 38–126)
Anion gap: 8 (ref 5–15)
BUN: 13 mg/dL (ref 6–20)
CHLORIDE: 106 mmol/L (ref 101–111)
CO2: 24 mmol/L (ref 22–32)
CREATININE: 0.92 mg/dL (ref 0.44–1.00)
Calcium: 9.1 mg/dL (ref 8.9–10.3)
GFR, EST NON AFRICAN AMERICAN: 56 mL/min — AB (ref >60)
Glucose, Bld: 124 mg/dL — ABNORMAL HIGH (ref 65–99)
POTASSIUM: 4.1 mmol/L (ref 3.5–5.1)
Sodium: 138 mmol/L (ref 135–145)
TOTAL PROTEIN: 6.5 g/dL (ref 6.5–8.1)
Total Bilirubin: 0.6 mg/dL (ref 0.3–1.2)

## 2017-04-06 LAB — LACTIC ACID, PLASMA
LACTIC ACID, VENOUS: 0.9 mmol/L (ref 0.5–1.9)
Lactic Acid, Venous: 1.1 mmol/L (ref 0.5–1.9)

## 2017-04-06 LAB — URINALYSIS, COMPLETE (UACMP) WITH MICROSCOPIC
BILIRUBIN URINE: NEGATIVE
Bacteria, UA: NONE SEEN
GLUCOSE, UA: NEGATIVE mg/dL
HGB URINE DIPSTICK: NEGATIVE
Ketones, ur: 5 mg/dL — AB
NITRITE: NEGATIVE
PH: 5 (ref 5.0–8.0)
Protein, ur: 30 mg/dL — AB
SPECIFIC GRAVITY, URINE: 1.027 (ref 1.005–1.030)

## 2017-04-06 LAB — INFLUENZA PANEL BY PCR (TYPE A & B)
Influenza A By PCR: NEGATIVE
Influenza B By PCR: NEGATIVE

## 2017-04-06 MED ORDER — CEFTRIAXONE SODIUM IN DEXTROSE 20 MG/ML IV SOLN
1.0000 g | Freq: Once | INTRAVENOUS | Status: AC
  Administered 2017-04-06: 1 g via INTRAVENOUS
  Filled 2017-04-06: qty 50

## 2017-04-06 MED ORDER — SODIUM CHLORIDE 0.9 % IV BOLUS (SEPSIS)
1000.0000 mL | Freq: Once | INTRAVENOUS | Status: AC
  Administered 2017-04-06: 1000 mL via INTRAVENOUS

## 2017-04-06 MED ORDER — AMOXICILLIN-POT CLAVULANATE 875-125 MG PO TABS: 1.0000 | ORAL_TABLET | Freq: Two times a day (BID) | ORAL | 0 refills | Status: AC

## 2017-04-06 MED ORDER — DOXYCYCLINE HYCLATE 100 MG PO CAPS: 100.0000 mg | ORAL_CAPSULE | Freq: Two times a day (BID) | ORAL | 0 refills | Status: AC

## 2017-04-06 MED ORDER — DOXYCYCLINE HYCLATE 100 MG PO TABS
100.0000 mg | ORAL_TABLET | Freq: Once | ORAL | Status: AC
  Administered 2017-04-06: 100 mg via ORAL
  Filled 2017-04-06: qty 1

## 2017-04-06 NOTE — ED Triage Notes (Signed)
EMS pt from [facility] , with fever 103.7 tylenol given at facility , EMS reports 99.3, hot to the touch , cough congestion x2 days , 114/59, HR 84 , 96% RA, DNR.

## 2017-04-06 NOTE — Discharge Instructions (Signed)

## 2017-04-06 NOTE — ED Notes (Signed)
In and out catheter preformed by this RN and Jae DireKate, Charity fundraiserN. Specimen obtained and sent to lab. Patient tolerated well.

## 2017-04-06 NOTE — ED Provider Notes (Signed)
[facility] Emergency Department Provider Note  ____________________________________________  Time seen: Approximately 5:53 PM  I have reviewed the triage vital signs and the nursing notes.   HISTORY  Chief Complaint Fever and Cough   HPI [name] is a 84 y.o. female the history of dementia who presents from her nursing home via EMS for fever and cough.Patient has had a cough productive of yellow sputum and congestion for 2 days. Today he had a fever of 103.56F. she was given Tylenol at the facility prior to transport. She has no nausea, no vomiting, no diarrhea, no dysuria, no shortness of breath or chest pain. Patient's vitals were normal per EMS.  Chief Complaint: fever Quality: constant Severity: very high Timing: since this morning Modifying factors: afebrile now after tylenol Associated signs/symptoms:  Cough and congestion    Past Medical History:  Diagnosis Date  . Dementia   . GAD (generalized anxiety disorder)   . 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 Squamous cell carcinoma     There are no active problems to display for this patient.   History reviewed. No pertinent surgical history.  Prior to Admission medications   Medication Sig Start Date End Date Taking? Authorizing Provider  acetaminophen (TYLENOL) 325 MG tablet Take 650 mg by mouth 3 (three) times daily.    [provider]  acetaminophen (TYLENOL) 500 MG tablet Take 500 mg by mouth every 4 (four) hours as needed for mild pain, fever or headache.    [provider]  alum & mag hydroxide-simeth (MINTOX) 200-200-20 MG/5ML suspension Take 30 mLs by mouth every 6 (six) hours as needed for indigestion or heartburn.    [provider]  amLODipine (NORVASC) 5 MG tablet Take 5 mg by mouth daily.    [provider]  amoxicillin-clavulanate (AUGMENTIN) 875-125 MG tablet Take 1 tablet by mouth 2 (two) times daily for  10 days. 04/06/17 04/16/17  Nita Sickle, MD  atorvastatin (LIPITOR) 40 MG tablet Take 40 mg by mouth daily.    [provider]  carvedilol (COREG) 6.25 MG tablet Take 6.25 mg by mouth 2 (two) times daily with a meal.    [provider]  Cholecalciferol (VITAMIN D) 2000 units CAPS Take 2,000 Units by mouth daily.    [provider]  divalproex (DEPAKOTE SPRINKLE) 125 MG capsule Take 250 mg by mouth 3 (three) times daily.    [provider]  donepezil (ARICEPT) 10 MG tablet Take 10 mg by mouth at bedtime.    [provider]  doxycycline (VIBRAMYCIN) 100 MG capsule Take 1 capsule (100 mg total) by mouth 2 (two) times daily for 10 days. 04/06/17 04/16/17  Nita Sickle, MD  guaifenesin (ROBITUSSIN) 100 MG/5ML syrup Take 200 mg by mouth 4 (four) times daily as needed for cough.    [provider]  loperamide (IMODIUM) 2 MG capsule Take 2 mg by mouth as needed for diarrhea or loose stools.    [provider]  magnesium hydroxide (MILK OF MAGNESIA) 400 MG/5ML suspension Take 30 mLs by mouth at bedtime as needed for mild constipation.    [provider]  magnesium oxide (MAG-OX) 400 MG tablet Take 400 mg by mouth daily.    [provider]  methocarbamol (ROBAXIN) 500 MG tablet Take 500 mg by mouth every 6 (six) hours as needed for muscle spasms.    [provider]  montelukast (SINGULAIR) 10 MG tablet Take 10 mg by mouth  at bedtime.    [provider]  neomycin-bacitracin-polymyxin (NEOSPORIN) ointment Apply 1 application topically as needed for wound care. apply to eye    [provider]  omeprazole (PRILOSEC) 20 MG capsule Take 20 mg by mouth daily.    [provider]  polyethylene glycol (MIRALAX / GLYCOLAX) packet Take 17 g by mouth daily.    [provider]  polyvinyl alcohol (LIQUIFILM TEARS) 1.4 % ophthalmic solution Place 1 drop into both eyes 4 (four) times daily.     [provider]  QUEtiapine (SEROQUEL) 25 MG tablet Take 25 mg by mouth daily.    [provider]  QUEtiapine (SEROQUEL) 50 MG tablet Take 50 mg by mouth at bedtime.    [provider]  Skin Protectants, Misc. (DIMETHICONE-ZINC OXIDE) cream Apply 1 application topically 2 (two) times daily as needed (redness/ open wounds).    [provider]  traZODone (DESYREL) 50 MG tablet Take 25 mg by mouth at bedtime.    [provider]    Allergies Patient has no known allergies.  No family history on file.  Social History Social History   Tobacco Use  . Smoking status: Never Smoker  . Smokeless tobacco: Never Used  Substance Use Topics  . Alcohol use: No  . Drug use: Not on file    Review of Systems  Constitutional: + fever. Eyes: Negative for visual changes. ENT: Negative for sore throat. Neck: No neck pain  Cardiovascular: Negative for chest pain. Respiratory: Negative for shortness of breath. + cough and congestion Gastrointestinal: Negative for abdominal pain, vomiting or diarrhea. Genitourinary: Negative for dysuria. Musculoskeletal: Negative for back pain. Skin: Negative for rash. Neurological: Negative for headaches, weakness or numbness. Psych: No SI or HI  ____________________________________________   PHYSICAL EXAM:  VITAL SIGNS: ED Triage Vitals  Enc Vitals Group     BP --      Pulse Rate 04/06/17 1735 81     Resp 04/06/17 1735 16     Temp 04/06/17 1735 99.4 F (37.4 C)     Temp Source 04/06/17 1735 Oral     SpO2 04/06/17 1735 93 %     Weight 04/06/17 1737 179 lb 3.7 oz (81.3 kg)     Height 04/06/17 1737 5\' 4"  (1.626 m)     Head Circumference --      Peak Flow --      Pain Score --      Pain Loc --      Pain Edu? --      Excl. in GC? --     Constitutional: Alert and oriented. Well appearing and in no apparent distress. HEENT:      Head: Normocephalic and atraumatic.         Eyes: Conjunctivae are normal.  Sclera is non-icteric.       Mouth/Throat: Mucous membranes are moist.       Neck: Supple with no signs of meningismus. Cardiovascular: Regular rate and rhythm. No murmurs, gallops, or rubs. 2+ symmetrical distal pulses are present in all extremities. No JVD. Respiratory: Normal respiratory effort. Lungs are clear to auscultation bilaterally. No wheezes, crackles, or rhonchi.  Gastrointestinal: Soft, non tender, and non distended with positive bowel sounds. No rebound or guarding. Musculoskeletal: Nontender with normal range of motion in all extremities. No edema, cyanosis, or erythema of extremities. Neurologic: Normal speech and language. Face is symmetric. Moving all extremities. No gross focal neurologic deficits are appreciated. Skin: Skin is warm, dry and intact.  No rash noted. Psychiatric: Mood and affect are normal. Speech and behavior are normal.  ____________________________________________   LABS (all labs ordered are listed, but only abnormal results are displayed)  Labs Reviewed  COMPREHENSIVE METABOLIC PANEL - Abnormal; Notable for the following components:      Result Value   Glucose, Bld 124 (*)    Albumin 3.3 (*)    AST 14 (*)    ALT 10 (*)    GFR calc non Af Amer 56 (*)    All other components within normal limits  CBC WITH DIFFERENTIAL/PLATELET - Abnormal; Notable for the following components:   Neutro Abs 7.3 (*)    All other components within normal limits  URINALYSIS, COMPLETE (UACMP) WITH MICROSCOPIC - Abnormal; Notable for the following components:   Color, Urine AMBER (*)    APPearance CLOUDY (*)    Ketones, ur 5 (*)    Protein, ur 30 (*)    Leukocytes, UA MODERATE (*)    Squamous Epithelial / LPF 0-5 (*)    All other components within normal limits  CULTURE, BLOOD (ROUTINE X 2)  CULTURE, BLOOD (ROUTINE X 2)  URINE CULTURE  LACTIC ACID, PLASMA  INFLUENZA PANEL BY PCR (TYPE A & B)  LACTIC ACID, PLASMA    ____________________________________________  EKG  ED ECG REPORT I, Nita Sicklearolina Ace Bergfeld, the attending physician, personally viewed and interpreted this ECG.  Normal sinus rhythm, rate of 80, normal intervals, normal axis, abnormal R-wave progression, diffuse T-wave flattening, no ST elevations or depressions. Unchanged from prior from 07/2016  ____________________________________________  RADIOLOGY  CXR: 1. Moderate-sized hiatal hernia. 2. Mild enlargement of the cardiopericardial silhouette. 3. Aortic Atherosclerosis (ICD10-I70.0).  ____________________________________________   PROCEDURES  Procedure(s) performed: None Procedures Critical Care performed:  None ____________________________________________   INITIAL IMPRESSION / ASSESSMENT AND PLAN / ED COURSE  84 y.o. female the history of dementia who presents from her nursing home via EMS for fever and cough. Patient arrives afebrile after receiving Tylenol at the skilled nursing facility, normal vital signs, she is actively coughing, lungs are clear to auscultation. Differential diagnoses including pneumonia versus influenza versus viral URI. We'll get labs, chest x-ray, flu swab, lactic acid and blood cultures. We'll give IV fluids and monitor temperature.    _________________________ 8:17 PM on 04/06/2017 -----------------------------------------  UA positive for UTI. Chest x-ray negative for pneumonia but clinically I do believe patient has pneumonia.  Her influenza was negative. Patient was given Rocephin and doxycycline for UTI and pneumonia. Her white count is normal, her Lactic is normal, vitals are normal. She remains very well appeaaring, afebrile, normal vitals, normal WOB and sats. At this time patient is stable for discharge and outpatient antibiotics, close follow-up with PCP. I discussed my return precautions with patient and her daughter. Patient will be sent home on Augmentin and doxycycline.   As part of  my medical decision making, I reviewed the following data within the electronic MEDICAL RECORD NUMBER Nursing notes reviewed and incorporated, Labs reviewed , EKG interpreted , Old EKG reviewed, Radiograph reviewed , Notes from prior ED visits and [place] Controlled Substance Database    Pertinent labs & imaging results that were available during my care of the patient were reviewed by me and considered in my medical decision making (see chart for details).    ____________________________________________   FINAL CLINICAL IMPRESSION(S) / ED DIAGNOSES  Final diagnoses:  Community acquired pneumonia, unspecified laterality  Acute cystitis with hematuria      NEW MEDICATIONS STARTED DURING THIS  VISIT:  ED Discharge Orders        Ordered    amoxicillin-clavulanate (AUGMENTIN) 875-125 MG tablet  2 times daily     04/06/17 2014    doxycycline (VIBRAMYCIN) 100 MG capsule  2 times daily     04/06/17 2014       Note:  This document was prepared using Dragon voice recognition software and may include unintentional dictation errors.    Don Perking, [place], MD 04/06/17 2020

## 2017-04-08 LAB — URINE CULTURE: Culture: <10000 — AB

## 2017-04-11 LAB — CULTURE, BLOOD (ROUTINE X 2)
Culture: NO GROWTH
Culture: NO GROWTH
SPECIAL REQUESTS: ADEQUATE
Special Requests: ADEQUATE

## 2018-03-15 ENCOUNTER — Emergency Department
Admission: EM | Admit: 2018-03-15 | Discharge: 2018-03-15 | Disposition: A | Discharge status: Skilled Nursing Facility | Payer: Medicare PPO | Attending: Emergency Medicine | Admitting: Emergency Medicine

## 2018-03-15 ENCOUNTER — Other Ambulatory Visit: Payer: Self-pay

## 2018-03-15 ENCOUNTER — Encounter: Payer: Self-pay | Admitting: Emergency Medicine

## 2018-03-15 DIAGNOSIS — K529 Noninfective gastroenteritis and colitis, unspecified: Secondary | ICD-10-CM | POA: Diagnosis not present

## 2018-03-15 DIAGNOSIS — R197 Diarrhea, unspecified: Secondary | ICD-10-CM | POA: Diagnosis present

## 2018-03-15 DIAGNOSIS — Z79899 Other long term (current) drug therapy: Secondary | ICD-10-CM | POA: Insufficient documentation

## 2018-03-15 DIAGNOSIS — R1110 Vomiting, unspecified: Secondary | ICD-10-CM | POA: Insufficient documentation

## 2018-03-15 DIAGNOSIS — F0390 Unspecified dementia without behavioral disturbance: Secondary | ICD-10-CM | POA: Diagnosis not present

## 2018-03-15 LAB — CBC WITH DIFFERENTIAL/PLATELET
ABS IMMATURE GRANULOCYTES: 0.02 10*3/uL (ref 0.00–0.07)
Basophils Absolute: 0 10*3/uL (ref 0.0–0.1)
Basophils Relative: 0 %
EOS PCT: 1 %
Eosinophils Absolute: 0.1 10*3/uL (ref 0.0–0.5)
HCT: 42.8 % (ref 36.0–46.0)
HEMOGLOBIN: 14 g/dL (ref 12.0–15.0)
Immature Granulocytes: 0 %
LYMPHS PCT: 17 %
Lymphs Abs: 1.9 10*3/uL (ref 0.7–4.0)
MCH: 32.5 pg (ref 26.0–34.0)
MCHC: 32.7 g/dL (ref 30.0–36.0)
MCV: 99.3 fL (ref 80.0–100.0)
MONO ABS: 0.5 10*3/uL (ref 0.1–1.0)
MONOS PCT: 5 %
NEUTROS ABS: 8.3 10*3/uL — AB (ref 1.7–7.7)
Neutrophils Relative %: 77 %
Platelets: 179 10*3/uL (ref 150–400)
RBC: 4.31 MIL/uL (ref 3.87–5.11)
RDW: 13.2 % (ref 11.5–15.5)
WBC: 10.8 10*3/uL — AB (ref 4.0–10.5)
nRBC: 0 % (ref 0.0–0.2)

## 2018-03-15 LAB — COMPREHENSIVE METABOLIC PANEL
ALT: 13 U/L (ref 0–44)
ANION GAP: 7 (ref 5–15)
AST: 15 U/L (ref 15–41)
Albumin: 3.7 g/dL (ref 3.5–5.0)
Alkaline Phosphatase: 133 U/L — ABNORMAL HIGH (ref 38–126)
BUN: 14 mg/dL (ref 8–23)
CALCIUM: 10.3 mg/dL (ref 8.9–10.3)
CO2: 29 mmol/L (ref 22–32)
CREATININE: 1.1 mg/dL — AB (ref 0.44–1.00)
Chloride: 104 mmol/L (ref 98–111)
GFR calc non Af Amer: 46 mL/min — ABNORMAL LOW (ref >60)
GFR, EST AFRICAN AMERICAN: 53 mL/min — AB (ref >60)
Glucose, Bld: 122 mg/dL — ABNORMAL HIGH (ref 70–99)
Potassium: 4.7 mmol/L (ref 3.5–5.1)
Sodium: 140 mmol/L (ref 135–145)
Total Bilirubin: 0.8 mg/dL (ref 0.3–1.2)
Total Protein: 7.2 g/dL (ref 6.5–8.1)

## 2018-03-15 LAB — LIPASE, BLOOD: Lipase: 29 U/L (ref 11–51)

## 2018-03-15 MED ORDER — ONDANSETRON HCL 4 MG PO TABS: 4.0000 mg | ORAL_TABLET | Freq: Three times a day (TID) | ORAL | 0 refills | Status: AC | PRN

## 2018-03-15 MED ORDER — ONDANSETRON HCL 4 MG/2ML IJ SOLN
4.0000 mg | Freq: Once | INTRAMUSCULAR | Status: AC
  Administered 2018-03-15: 4 mg via INTRAVENOUS
  Filled 2018-03-15: qty 2

## 2018-03-15 MED ORDER — SODIUM CHLORIDE 0.9 % IV BOLUS
500.0000 mL | Freq: Once | INTRAVENOUS | Status: AC
  Administered 2018-03-15: 500 mL via INTRAVENOUS

## 2018-03-15 NOTE — ED Provider Notes (Addendum)
[name] Emergency Department Provider Note  ____________________________________________   I have reviewed the triage vital signs and the nursing notes. Where available I have reviewed prior notes and, if possible and indicated, outside hospital notes.    HISTORY  Chief Complaint Diarrhea and Emesis    HPI [name] is a 85 y.o. female  History of significant dementia, DNR, presents today complaining of vomiting and diarrhea according to EMS she is at her baseline in all respects had 2 episodes of vomiting and 2 episodes of diarrhea today nonbloody nonbilious no melena, she has no complaints of pain her vital signs were normal and she is otherwise doing well.  Multiple different people in the nursing home have similar complaints. Level 5 chart caveat; no further history available due to patient status.   Past Medical History:  Diagnosis Date  . Dementia (HCC)   . GAD (generalized anxiety disorder)   . 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 Squamous cell carcinoma     There are no active problems to display for this patient.   History reviewed. No pertinent surgical history.  Prior to Admission medications   Medication Sig Start Date End Date Taking? Authorizing Provider  acetaminophen (TYLENOL) 325 MG tablet Take 650 mg by mouth 3 (three) times daily.    [provider]  acetaminophen (TYLENOL) 500 MG tablet Take 500 mg by mouth every 4 (four) hours as needed for mild pain, fever or headache.    [provider]  alum & mag hydroxide-simeth (MINTOX) 200-200-20 MG/5ML suspension Take 30 mLs by mouth every 6 (six) hours as needed for indigestion or heartburn.    [provider]  amLODipine (NORVASC) 5 MG tablet Take 5 mg by mouth daily.    [provider]  atorvastatin (LIPITOR) 40 MG tablet Take 40 mg by mouth daily.    [provider]  carvedilol (COREG) 6.25 MG tablet Take  6.25 mg by mouth 2 (two) times daily with a meal.    [provider]  Cholecalciferol (VITAMIN D) 2000 units CAPS Take 2,000 Units by mouth daily.    [provider]  divalproex (DEPAKOTE SPRINKLE) 125 MG capsule Take 250 mg by mouth 3 (three) times daily.    [provider]  donepezil (ARICEPT) 10 MG tablet Take 10 mg by mouth at bedtime.    [provider]  guaifenesin (ROBITUSSIN) 100 MG/5ML syrup Take 200 mg by mouth 4 (four) times daily as needed for cough.    [provider]  loperamide (IMODIUM) 2 MG capsule Take 2 mg by mouth as needed for diarrhea or loose stools.    [provider]  magnesium hydroxide (MILK OF MAGNESIA) 400 MG/5ML suspension Take 30 mLs by mouth at bedtime as needed for mild constipation.    [provider]  magnesium oxide (MAG-OX) 400 MG tablet Take 400 mg by mouth daily.    [provider]  methocarbamol (ROBAXIN) 500 MG tablet Take 500 mg by mouth every 6 (six) hours as needed for muscle spasms.    [provider]  montelukast (SINGULAIR) 10 MG tablet Take 10 mg by mouth at bedtime.    [provider]  neomycin-bacitracin-polymyxin (NEOSPORIN) ointment Apply 1 application topically as needed for wound care. apply to eye    [provider]  omeprazole (PRILOSEC) 20 MG capsule Take 20 mg by mouth daily.    [provider]  polyethylene glycol (MIRALAX / GLYCOLAX) packet  Take 17 g by mouth daily.    [provider]  polyvinyl alcohol (LIQUIFILM TEARS) 1.4 % ophthalmic solution Place 1 drop into both eyes 4 (four) times daily.    [provider]  QUEtiapine (SEROQUEL) 25 MG tablet Take 25 mg by mouth daily.    [provider]  QUEtiapine (SEROQUEL) 50 MG tablet Take 50 mg by mouth at bedtime.    [provider]  Skin Protectants, Misc. (DIMETHICONE-ZINC OXIDE) cream Apply 1 application topically 2 (two) times daily as needed  (redness/ open wounds).    [provider]  traZODone (DESYREL) 50 MG tablet Take 25 mg by mouth at bedtime.    [provider]    Allergies Patient has no known allergies.  No family history on file.  Social History Social History   Tobacco Use  . Smoking status: Never Smoker  . Smokeless tobacco: Never Used  Substance Use Topics  . Alcohol use: No  . Drug use: Not on file    Review of Systems Level 5 chart caveat; no further history available due to patient status.   ____________________________________________   PHYSICAL EXAM:  VITAL SIGNS: ED Triage Vitals  Enc Vitals Group     BP 03/15/18 1219 133/81     Pulse Rate 03/15/18 1219 (!) 57     Resp 03/15/18 1219 12     Temp 03/15/18 1219 (!) 97.4 F (36.3 C)     Temp Source 03/15/18 1219 Oral     SpO2 03/15/18 1219 99 %     Weight 03/15/18 1220 170 lb (77.1 kg)     Height 03/15/18 1220 5\' 2"  (1.575 m)     Head Circumference --      Peak Flow --      Pain Score --      Pain Loc --      Pain Edu? --      Excl. in GC? --     Constitutional: Alert and oriented to name, pleasantly demented no acute distress. Eyes: Conjunctivae are normal Head: Atraumatic HEENT: No congestion/rhinnorhea. Mucous membranes are slightly dry.  Oropharynx non-erythematous Neck:   Nontender with no meningismus, no masses, no stridor Cardiovascular: Normal rate, regular rhythm. Grossly normal heart sounds.  Good peripheral circulation. Respiratory: Normal respiratory effort.  No retractions. Lungs CTAB. Abdominal: Soft and nontender. No distention. No guarding no rebound Back:  There is no focal tenderness or step off.  there is no midline tenderness there are no lesions noted. there is no CVA tenderness Musculoskeletal: No lower extremity tenderness, no upper extremity tenderness. No joint effusions, no DVT signs strong distal pulses no edema Neurologic:  Normal speech and language. No gross focal neurologic deficits  are appreciated.  Skin:  Skin is warm, dry and intact. No rash noted. Psychiatric: Mood and affect are normal. Speech and behavior are normal.  ____________________________________________   LABS (all labs ordered are listed, but only abnormal results are displayed)  Labs Reviewed  CBC WITH DIFFERENTIAL/PLATELET  COMPREHENSIVE METABOLIC PANEL  LIPASE, BLOOD    Pertinent labs  results that were available during my care of the patient were reviewed by me and considered in my medical decision making (see chart for details). ____________________________________________  EKG  I personally interpreted any EKGs ordered by me or triage  ____________________________________________  RADIOLOGY  Pertinent labs & imaging results that were available during my care of the patient were reviewed by me and considered in my medical decision making (see chart for details).  If possible, patient and/or family made aware of any abnormal findings.  No results found. ____________________________________________    PROCEDURES  Procedure(s) performed: None  Procedures  Critical Care performed: None  ____________________________________________   INITIAL IMPRESSION / ASSESSMENT AND PLAN / ED COURSE  Pertinent labs & imaging results that were available during my care of the patient were reviewed by me and considered in my medical decision making (see chart for details).  Demented patient with mild nausea and vomiting this morning, this is likely something that could be handled by the nursing facility but we will give her IV fluid check basic blood work and reassess.  Not she is in kidney failure there is some other acute process I think this likely could be safely handled as an outpatient.  At least that she appears right now  ----------------------------------------- 2:26 PM on 03/15/2018 ----------------------------------------- Patient able to eat and drink with no difficulty here,  abdomen remains benign, she has had a few hours of nausea and vomiting diarrhea.  Family are at bedside.  They do not want any further work-up and they do not want her admitted they would like her discharge.  Patient is in no acute distress and at her baseline in all respects.  Given that we will discharge her with close outpatient follow-up return precautions given and understood.  Low suspicion for ACS or PE or dissection myocarditis endocarditis or any acute abdominal process given her findings, patient had 2 episodes of diarrhea 2 episodes of vomiting over the course of a couple hours and no other symptoms.  She is at a facility that should be able to handle this, and family does not want her admitted here.  We will discharge with return precautions given understood    ____________________________________________   FINAL CLINICAL IMPRESSION(S) / ED DIAGNOSES  Final diagnoses:  None      This chart was dictated using voice recognition software.  Despite best efforts to proofread,  errors can occur which can change meaning.   3   Jeanmarie PlantMcShane, Hudson Majkowski A, MD 03/15/18 1226    Jeanmarie PlantMcShane, Morty Ortwein A, MD 03/15/18 [phone]

## 2018-03-15 NOTE — ED Triage Notes (Signed)
Pt arrived via ems from [facility] with concerns over emesis and diarrhea that started this morning. EMS reports 2 episodes of diarrhea and 2 episodes of emesis. Pt poor historian. HX of dementia. Vitals WDL, no fever.

## 2018-03-15 NOTE — ED Notes (Signed)
Daughter made aware of plan and states she will contacted [facility] and go see pt this evening.

## 2018-03-15 NOTE — ED Notes (Signed)
Pt able to tolerate food and water  

## 2019-06-24 ENCOUNTER — Observation Stay: Payer: Medicare Other

## 2019-06-24 ENCOUNTER — Emergency Department: Payer: Medicare Other

## 2019-06-24 ENCOUNTER — Other Ambulatory Visit: Payer: Self-pay

## 2019-06-24 ENCOUNTER — Inpatient Hospital Stay
Admission: EM | Admit: 2019-06-24 | Discharge: 2019-06-26 | DRG: 071 | Disposition: A | Discharge status: Skilled Nursing Facility | Payer: Medicare Other | Attending: Internal Medicine | Admitting: Internal Medicine

## 2019-06-24 DIAGNOSIS — R55 Syncope and collapse: Secondary | ICD-10-CM | POA: Diagnosis present

## 2019-06-24 DIAGNOSIS — I10 Essential (primary) hypertension: Secondary | ICD-10-CM | POA: Diagnosis present

## 2019-06-24 DIAGNOSIS — Z20822 Contact with and (suspected) exposure to covid-19: Secondary | ICD-10-CM | POA: Diagnosis present

## 2019-06-24 DIAGNOSIS — E860 Dehydration: Secondary | ICD-10-CM | POA: Diagnosis not present

## 2019-06-24 DIAGNOSIS — F0391 Unspecified dementia with behavioral disturbance: Secondary | ICD-10-CM | POA: Diagnosis present

## 2019-06-24 DIAGNOSIS — T461X5A Adverse effect of calcium-channel blockers, initial encounter: Secondary | ICD-10-CM | POA: Diagnosis present

## 2019-06-24 DIAGNOSIS — R4182 Altered mental status, unspecified: Secondary | ICD-10-CM | POA: Diagnosis not present

## 2019-06-24 DIAGNOSIS — Z85828 Personal history of other malignant neoplasm of skin: Secondary | ICD-10-CM

## 2019-06-24 DIAGNOSIS — T447X5A Adverse effect of beta-adrenoreceptor antagonists, initial encounter: Secondary | ICD-10-CM | POA: Diagnosis present

## 2019-06-24 DIAGNOSIS — F03918 Unspecified dementia, unspecified severity, with other behavioral disturbance: Secondary | ICD-10-CM | POA: Diagnosis present

## 2019-06-24 DIAGNOSIS — G4700 Insomnia, unspecified: Secondary | ICD-10-CM | POA: Diagnosis present

## 2019-06-24 DIAGNOSIS — Z79899 Other long term (current) drug therapy: Secondary | ICD-10-CM

## 2019-06-24 DIAGNOSIS — F411 Generalized anxiety disorder: Secondary | ICD-10-CM | POA: Diagnosis present

## 2019-06-24 DIAGNOSIS — F0151 Vascular dementia with behavioral disturbance: Secondary | ICD-10-CM | POA: Diagnosis present

## 2019-06-24 DIAGNOSIS — E785 Hyperlipidemia, unspecified: Secondary | ICD-10-CM | POA: Diagnosis present

## 2019-06-24 DIAGNOSIS — G9341 Metabolic encephalopathy: Principal | ICD-10-CM | POA: Diagnosis present

## 2019-06-24 DIAGNOSIS — Z66 Do not resuscitate: Secondary | ICD-10-CM | POA: Diagnosis present

## 2019-06-24 DIAGNOSIS — H9190 Unspecified hearing loss, unspecified ear: Secondary | ICD-10-CM | POA: Diagnosis present

## 2019-06-24 DIAGNOSIS — Z7983 Long term (current) use of bisphosphonates: Secondary | ICD-10-CM

## 2019-06-24 DIAGNOSIS — I959 Hypotension, unspecified: Secondary | ICD-10-CM | POA: Diagnosis present

## 2019-06-24 LAB — URINE DRUG SCREEN, QUALITATIVE (ARMC ONLY)
Amphetamines, Ur Screen: NOT DETECTED
Barbiturates, Ur Screen: NOT DETECTED
Benzodiazepine, Ur Scrn: NOT DETECTED
Cannabinoid 50 Ng, Ur ~~LOC~~: NOT DETECTED
Cocaine Metabolite,Ur ~~LOC~~: NOT DETECTED
MDMA (Ecstasy)Ur Screen: NOT DETECTED
Methadone Scn, Ur: NOT DETECTED
Opiate, Ur Screen: NOT DETECTED
Phencyclidine (PCP) Ur S: NOT DETECTED
Tricyclic, Ur Screen: NOT DETECTED

## 2019-06-24 LAB — CBC WITH DIFFERENTIAL/PLATELET
Abs Immature Granulocytes: 0.01 10*3/uL (ref 0.00–0.07)
Basophils Absolute: 0 10*3/uL (ref 0.0–0.1)
Basophils Relative: 0 %
Eosinophils Absolute: 0 10*3/uL (ref 0.0–0.5)
Eosinophils Relative: 0 %
HCT: 41.1 % (ref 36.0–46.0)
Hemoglobin: 13.9 g/dL (ref 12.0–15.0)
Immature Granulocytes: 0 %
Lymphocytes Relative: 29 %
Lymphs Abs: 1.4 10*3/uL (ref 0.7–4.0)
MCH: 33.3 pg (ref 26.0–34.0)
MCHC: 33.8 g/dL (ref 30.0–36.0)
MCV: 98.6 fL (ref 80.0–100.0)
Monocytes Absolute: 0.3 10*3/uL (ref 0.1–1.0)
Monocytes Relative: 5 %
Neutro Abs: 3.1 10*3/uL (ref 1.7–7.7)
Neutrophils Relative %: 66 %
Platelets: 167 10*3/uL (ref 150–400)
RBC: 4.17 MIL/uL (ref 3.87–5.11)
RDW: 13.1 % (ref 11.5–15.5)
WBC: 4.8 10*3/uL (ref 4.0–10.5)
nRBC: 0 % (ref 0.0–0.2)

## 2019-06-24 LAB — COMPREHENSIVE METABOLIC PANEL
ALT: 21 U/L (ref 0–44)
AST: 22 U/L (ref 15–41)
Albumin: 3.5 g/dL (ref 3.5–5.0)
Alkaline Phosphatase: 97 U/L (ref 38–126)
Anion gap: 6 (ref 5–15)
BUN: 25 mg/dL — ABNORMAL HIGH (ref 8–23)
CO2: 26 mmol/L (ref 22–32)
Calcium: 9.7 mg/dL (ref 8.9–10.3)
Chloride: 110 mmol/L (ref 98–111)
Creatinine, Ser: 0.98 mg/dL (ref 0.44–1.00)
GFR calc Af Amer: >60 mL/min (ref >60)
GFR calc non Af Amer: 52 mL/min — ABNORMAL LOW (ref >60)
Glucose, Bld: 162 mg/dL — ABNORMAL HIGH (ref 70–99)
Potassium: 4.2 mmol/L (ref 3.5–5.1)
Sodium: 142 mmol/L (ref 135–145)
Total Bilirubin: 0.5 mg/dL (ref 0.3–1.2)
Total Protein: 6.6 g/dL (ref 6.5–8.1)

## 2019-06-24 LAB — URINALYSIS, COMPLETE (UACMP) WITH MICROSCOPIC
Bilirubin Urine: NEGATIVE
Glucose, UA: NEGATIVE mg/dL
Hgb urine dipstick: NEGATIVE
Ketones, ur: NEGATIVE mg/dL
Nitrite: NEGATIVE
Protein, ur: NEGATIVE mg/dL
Specific Gravity, Urine: 1.006 (ref 1.005–1.030)
Squamous Epithelial / HPF: NONE SEEN (ref 0–5)
pH: 8 (ref 5.0–8.0)

## 2019-06-24 LAB — VALPROIC ACID LEVEL: Valproic Acid Lvl: <10 ug/mL — ABNORMAL LOW (ref 50.0–100.0)

## 2019-06-24 MED ORDER — ENSURE ENLIVE PO LIQD
237.0000 mL | Freq: Every day | ORAL | Status: DC
  Administered 2019-06-25 – 2019-06-26 (×2): 237 mL via ORAL

## 2019-06-24 MED ORDER — MAGNESIUM HYDROXIDE 400 MG/5ML PO SUSP
30.0000 mL | Freq: Every day | ORAL | Status: DC | PRN
  Filled 2019-06-24: qty 30

## 2019-06-24 MED ORDER — SODIUM CHLORIDE 0.9 % IV SOLN: INTRAVENOUS | Status: DC

## 2019-06-24 MED ORDER — VALPROIC ACID 250 MG/5ML PO SOLN
250.0000 mg | Freq: Two times a day (BID) | ORAL | Status: DC
  Administered 2019-06-25 – 2019-06-26 (×3): 250 mg via ORAL
  Filled 2019-06-24 (×6): qty 5

## 2019-06-24 MED ORDER — CALCIUM CARBONATE-VITAMIN D 500-200 MG-UNIT PO TABS
1.0000 | ORAL_TABLET | Freq: Two times a day (BID) | ORAL | Status: DC
  Administered 2019-06-25 – 2019-06-26 (×3): 1 via ORAL
  Filled 2019-06-24 (×4): qty 1

## 2019-06-24 MED ORDER — RISPERIDONE 1 MG/ML PO SOLN
0.2500 mg | Freq: Two times a day (BID) | ORAL | Status: DC
  Administered 2019-06-25 – 2019-06-26 (×3): 0.25 mg via ORAL
  Filled 2019-06-24 (×7): qty 0.25

## 2019-06-24 MED ORDER — AMLODIPINE BESYLATE 5 MG PO TABS
5.0000 mg | ORAL_TABLET | Freq: Every day | ORAL | Status: DC
  Administered 2019-06-25: 5 mg via ORAL
  Filled 2019-06-24 (×2): qty 1

## 2019-06-24 MED ORDER — ENOXAPARIN SODIUM 40 MG/0.4ML ~~LOC~~ SOLN
40.0000 mg | SUBCUTANEOUS | Status: DC
  Administered 2019-06-25 – 2019-06-26 (×2): 40 mg via SUBCUTANEOUS
  Filled 2019-06-24 (×2): qty 0.4

## 2019-06-24 MED ORDER — ASPIRIN EC 81 MG PO TBEC
81.0000 mg | DELAYED_RELEASE_TABLET | Freq: Every day | ORAL | Status: DC
  Administered 2019-06-25 – 2019-06-26 (×2): 81 mg via ORAL
  Filled 2019-06-24 (×2): qty 1

## 2019-06-24 MED ORDER — ONDANSETRON HCL 4 MG/2ML IJ SOLN: 4.0000 mg | Freq: Four times a day (QID) | INTRAMUSCULAR | Status: DC | PRN

## 2019-06-24 MED ORDER — QUETIAPINE FUMARATE 25 MG PO TABS
50.0000 mg | ORAL_TABLET | Freq: Every day | ORAL | Status: DC
  Administered 2019-06-25 (×2): 50 mg via ORAL
  Filled 2019-06-24 (×2): qty 2

## 2019-06-24 MED ORDER — QUETIAPINE FUMARATE 25 MG PO TABS: 50.0000 mg | ORAL_TABLET | Freq: Every day | ORAL | Status: DC

## 2019-06-24 MED ORDER — TRAZODONE HCL 50 MG PO TABS
25.0000 mg | ORAL_TABLET | Freq: Every evening | ORAL | Status: DC | PRN
  Administered 2019-06-25 – 2019-06-26 (×2): 25 mg via ORAL
  Filled 2019-06-24 (×2): qty 1

## 2019-06-24 MED ORDER — RISPERIDONE 1 MG/ML PO SOLN: 0.2500 mg | Freq: Two times a day (BID) | ORAL | Status: DC

## 2019-06-24 MED ORDER — ONDANSETRON HCL 4 MG PO TABS: 4.0000 mg | ORAL_TABLET | Freq: Three times a day (TID) | ORAL | Status: DC | PRN

## 2019-06-24 MED ORDER — ALENDRONATE SODIUM 70 MG PO TABS: 70.0000 mg | ORAL_TABLET | ORAL | Status: DC

## 2019-06-24 MED ORDER — MAGNESIUM OXIDE 400 (241.3 MG) MG PO TABS
400.0000 mg | ORAL_TABLET | Freq: Two times a day (BID) | ORAL | Status: DC
  Administered 2019-06-25 – 2019-06-26 (×3): 400 mg via ORAL
  Filled 2019-06-24 (×3): qty 1

## 2019-06-24 MED ORDER — CARVEDILOL 3.125 MG PO TABS
6.2500 mg | ORAL_TABLET | Freq: Two times a day (BID) | ORAL | Status: DC
  Administered 2019-06-25 (×2): 6.25 mg via ORAL
  Filled 2019-06-24: qty 2
  Filled 2019-06-24 (×2): qty 1

## 2019-06-24 MED ORDER — QUETIAPINE FUMARATE 25 MG PO TABS: 25.0000 mg | ORAL_TABLET | Freq: Every day | ORAL | Status: DC

## 2019-06-24 MED ORDER — ACETAMINOPHEN 650 MG RE SUPP: 650.0000 mg | Freq: Four times a day (QID) | RECTAL | Status: DC | PRN

## 2019-06-24 MED ORDER — MONTELUKAST SODIUM 10 MG PO TABS
10.0000 mg | ORAL_TABLET | Freq: Every day | ORAL | Status: DC
  Administered 2019-06-25: 10 mg via ORAL
  Filled 2019-06-24 (×2): qty 1

## 2019-06-24 MED ORDER — ONDANSETRON HCL 4 MG PO TABS: 4.0000 mg | ORAL_TABLET | Freq: Four times a day (QID) | ORAL | Status: DC | PRN

## 2019-06-24 MED ORDER — ACETAMINOPHEN 325 MG PO TABS: 650.0000 mg | ORAL_TABLET | Freq: Four times a day (QID) | ORAL | Status: DC | PRN

## 2019-06-24 MED ORDER — POLYETHYLENE GLYCOL 3350 17 G PO PACK
17.0000 g | PACK | Freq: Every day | ORAL | Status: DC
  Administered 2019-06-25: 17 g via ORAL
  Filled 2019-06-24 (×2): qty 1

## 2019-06-24 MED ORDER — ATORVASTATIN CALCIUM 20 MG PO TABS
40.0000 mg | ORAL_TABLET | Freq: Every day | ORAL | Status: DC
  Administered 2019-06-25 – 2019-06-26 (×2): 40 mg via ORAL
  Filled 2019-06-24 (×2): qty 2

## 2019-06-24 NOTE — ED Notes (Signed)
Admitting Dr at bedside

## 2019-06-24 NOTE — ED Notes (Signed)
Repeat green top sent to lab °

## 2019-06-24 NOTE — ED Triage Notes (Signed)
Pt arrives via EMS from [facility] for AMS- pt was walking down hall and complained of sudden dizziness per staff- pt is normally able to verbalize and walk around but is unable to hold a conversation now- pt has a strong urine smell- pt has a hx of dementia- pinpoint pupils noted

## 2019-06-24 NOTE — ED Provider Notes (Signed)
[facility] Emergency Department Provider Note   ____________________________________________   First MD Initiated Contact with Patient 06/24/19 1742     (approximate)  I have reviewed the triage vital signs and the nursing notes.   HISTORY  Chief Complaint Altered Mental Status    HPI Cole Klugh is a 86 y.o. female with past medical history of dementia and generalized anxiety disorder who presents to the ED for altered mental status.  Per EMS, [facility] staff had noted that patient seemed to have an acute change in her mental status while walking down the hallway.  She complained of sudden dizziness and then had a hard time communicating.  At baseline, she typically ambulates without difficulty and is able to hold a conversation but with some confusion.  Both staff and EMS noted that patient unable to respond verbally, has seemed much more sleepy than usual.  There was no fall or other trauma reported and patient had reportedly been doing well prior to today.        Past Medical History:  Diagnosis Date  . Dementia ([place])   . GAD (generalized anxiety disorder)   . 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 Squamous cell carcinoma     Patient Active Problem List   Diagnosis Date Noted  . AMS (altered mental status) 06/24/2019    No past surgical history on file.  Prior to Admission medications   Medication Sig Start Date End Date Taking? Authorizing Provider  acetaminophen (TYLENOL) 325 MG tablet Take 650 mg by mouth 3 (three) times daily as needed.   Yes [provider]  alendronate (FOSAMAX) 70 MG tablet Take 70 mg by mouth once a week.   Yes [provider]  amLODipine (NORVASC) 5 MG tablet Take 5 mg by mouth daily.   Yes [provider]  atorvastatin (LIPITOR) 40 MG tablet Take 40 mg by mouth daily.   Yes [provider]  calcium citrate-vitamin D (CITRACAL+D) 315-200 MG-UNIT  tablet Take 1 tablet by mouth 2 (two) times daily.   Yes [provider]  carvedilol (COREG) 6.25 MG tablet Take 6.25 mg by mouth 2 (two) times daily with a meal.   Yes [provider]  donepezil (ARICEPT) 10 MG tablet Take 10 mg by mouth at bedtime.   Yes [provider]  ENSURE (ENSURE) Take 237 mLs by mouth daily.   Yes [provider]  guaifenesin (ROBITUSSIN) 100 MG/5ML syrup Take 200 mg by mouth 3 (three) times daily as needed for cough.   Yes [provider]  hyoscyamine (LEVSIN) 0.125 MG tablet Take 0.125 mg by mouth every 3 (three) hours as needed.   Yes [provider]  loperamide (IMODIUM) 2 MG capsule Take 2 mg by mouth as needed for diarrhea or loose stools.   Yes [provider]  LORazepam (ATIVAN) 0.5 MG tablet Take 0.5 mg by mouth every 4 (four) hours as needed for anxiety.   Yes [provider]  magnesium oxide (MAG-OX) 400 MG tablet Take 400 mg by mouth 2 (two) times daily.    Yes [provider]  montelukast (SINGULAIR) 10 MG tablet Take 10 mg by mouth at bedtime.   Yes [provider]  neomycin-bacitracin-polymyxin (NEOSPORIN) [phone] ointment Apply 1 application topically 4 (four) times daily.   Yes [provider]  nystatin (NYSTATIN) powder Apply 1 application topically 2 (two) times daily.   Yes [provider]  OLANZapine (ZYPREXA) 2.5  MG tablet Take 1.25 mg by mouth daily.   Yes [provider]  omeprazole (PRILOSEC) 20 MG capsule Take 20 mg by mouth daily.   Yes [provider]  ondansetron (ZOFRAN) 4 MG tablet Take 1 tablet (4 mg total) by mouth every 8 (eight) hours as needed for nausea or vomiting. 03/15/18  Yes Jeanmarie Plant, MD  polyethylene glycol (MIRALAX / GLYCOLAX) packet Take 17 g by mouth daily.   Yes [provider]  saccharomyces boulardii (FLORASTOR) 250 MG capsule Take 250 mg by mouth 2 (two) times daily.   Yes [provider]  traZODone (DESYREL) 50 MG tablet Take 25 mg by mouth at bedtime.   Yes [provider]  Turmeric Curcumin 500 MG CAPS Take 500 mg by mouth daily.   Yes [provider]  valproic acid (DEPAKENE) 250 MG/5ML SOLN solution Take 250 mg by mouth 2 (two) times daily.   Yes [provider]  QUEtiapine (SEROQUEL) 25 MG tablet Take 25 mg by mouth daily. At 2 pm    [provider]  QUEtiapine (SEROQUEL) 50 MG tablet Take 50 mg by mouth at bedtime.    [provider]  risperiDONE (RISPERDAL) 1 MG/ML oral solution Take 0.25 mg by mouth 2 (two) times daily.    [provider]    Allergies Patient has no known allergies.  No family history on file.  Social History Social History   Tobacco Use  . Smoking status: Never Smoker  . Smokeless tobacco: Never Used  Substance Use Topics  . Alcohol use: No  . Drug use: Not on file    Review of Systems Unable to obtain secondary to altered mental status  ____________________________________________   PHYSICAL EXAM:  VITAL SIGNS: ED Triage Vitals [06/24/19 1743]  Enc Vitals Group     BP      Pulse      Resp      Temp      Temp src      SpO2      Weight 176 lb 5.9 oz (80 kg)     Height 5\' 5"  (1.651 m)     Head Circumference      Peak Flow      Pain Score 0     Pain Loc      Pain Edu?      Excl. in GC?     Constitutional: Somnolent but arousable to voice. Eyes: Conjunctivae are normal.  Pupils pinpoint bilaterally. Head: Atraumatic. Nose: No congestion/rhinnorhea. Mouth/Throat: Mucous membranes are moist. Neck: Normal ROM Cardiovascular: Normal rate, regular rhythm. Grossly normal heart sounds. Respiratory: Normal respiratory effort.  No retractions. Lungs CTAB. Gastrointestinal: Soft and nontender. No distention. Genitourinary: deferred Musculoskeletal: No lower extremity tenderness nor edema. Neurologic: Unable to verbally communicate, able to follow basic  commands with repeated encouragement. No gross focal neurologic deficits are appreciated, moving all extremities equally. Skin:  Skin is warm, dry and intact. No rash noted. Psychiatric: Mood and affect are normal. Speech and behavior are normal.  ____________________________________________   LABS (all labs ordered are listed, but only abnormal results are displayed)  Labs Reviewed  URINALYSIS, COMPLETE (UACMP) WITH MICROSCOPIC - Abnormal; Notable for the following components:      Result Value   Color, Urine STRAW (*)    APPearance CLEAR (*)    Leukocytes,Ua MODERATE (*)    Bacteria, UA RARE (*)    All other components within normal limits  VALPROIC ACID LEVEL - Abnormal;  Notable for the following components:   Valproic Acid Lvl <10 (*)    All other components within normal limits  COMPREHENSIVE METABOLIC PANEL - Abnormal; Notable for the following components:   Glucose, Bld 162 (*)    BUN 25 (*)    GFR calc non Af Amer 52 (*)    All other components within normal limits  SARS CORONAVIRUS 2 (TAT 6-24 HRS)  URINE CULTURE  CBC WITH DIFFERENTIAL/PLATELET  URINE DRUG SCREEN, QUALITATIVE ([facility] ONLY)  BASIC METABOLIC PANEL  CBC  LIPID PANEL   ____________________________________________  EKG  ED ECG REPORT I, Chesley Noon, the attending physician, personally viewed and interpreted this ECG.   Date: 06/24/2019  EKG Time: 18:13  Rate: 81  Rhythm: normal sinus rhythm  Axis: Normal  Intervals:none  ST&T Change: None   PROCEDURES  Procedure(s) performed (including Critical Care):  Procedures   ____________________________________________   INITIAL IMPRESSION / ASSESSMENT AND PLAN / ED COURSE       86 year old female presents to the ED complaining of altered mental status noted by staff at [facility] house since earlier today, when she had also complained of dizziness.  She is somnolent but arousable to voice here in the ED and able to follow some basic commands  with repeated encouragement.  She seems to be moving all extremities equally with no focal neurologic deficits, although she does have pinpoint pupils bilaterally.  She does not have any opiate medications on her list, but does take Depakote and we will check levels of this.  Differential includes medication effect, intracranial process, electrolyte abnormality, UTI.  Plan to check CT head and lab work.  CT head and lab work appears unremarkable.  No evidence of infection on UA.  Patient's mental status seems to be gradually improving, she is now able to answer some questions, oriented to person but not place or time.  I suspect the changes in her mental status were due to medication effect, but any recent medication changes are unclear as she is primarily seen by doctors making house calls.  We will admit for further observation and case was discussed with hospitalist.      ____________________________________________   FINAL CLINICAL IMPRESSION(S) / ED DIAGNOSES  Final diagnoses:  Altered mental status, unspecified altered mental status type     ED Discharge Orders    None       Note:  This document was prepared using Dragon voice recognition software and may include unintentional dictation errors.   Chesley Noon, MD 06/25/19 0110

## 2019-06-24 NOTE — ED Notes (Signed)
Pt refused covid swab stating "don't you dare, I don't want that"

## 2019-06-24 NOTE — ED Notes (Signed)
Pt cleansed of diarrhea- pt linens changed- clean brief placed on pt

## 2019-06-24 NOTE — H&P (Signed)
[place] at [facility]   PATIENT NAME: [name]    MR#:  [id number]  DATE OF BIRTH:  [birth year]  DATE OF ADMISSION:  06/24/2019  PRIMARY CARE PHYSICIAN: Lauro Regulus, MD   REQUESTING/REFERRING PHYSICIAN: Chesley Noon, MD  CHIEF COMPLAINT:   Chief Complaint  Patient presents with  . Altered Mental Status    HISTORY OF PRESENT ILLNESS:  [name]  is a 86 y.o. Caucasian female with a known history of multi-infarct dementia with behavioral changes, hypertension, peripheral vascular disease, GERD, and anxiety disorder who at baseline is ambulatory and verbally interactive at her skilled nursing facility and presents to the emergency room with acute onset of altered mental status while walking down the hallway.  She complained of sudden dizziness and they Ms. staff had a hard time coming getting with her.  She typically ambulates without difficulty and is able to hold a conversation with some confusion.  No witnessed falls or trauma.  No reported fever or chills.  The patient is a fairly poor historian.  She denied any chest pain or dyspnea or palpitations, cough or wheezing, nausea or vomiting or diarrhea or abdominal pain.  She denied any headache or  blurred vision.  She has been noted to have increasing confusion today.  Upon presentation to the emergency room, blood pressure was 168/63 with otherwise normal vital signs.  Labs revealed a blood glucose of 162 and BUN of 25 with otherwise normal CMP.  CBC was unremarkable.  Urine drug screen came back negative.  Urinalysis showed 6-10 WBCs with 0-5 RBCs and rare bacteria and moderate leukocytes.  Noncontrasted head CT scan showed general cerebral atrophy with no acute intracranial abnormalities.  EKG showed normal sinus rhythm with rate of 81 with low voltage QRS, poor R wave progression and T wave inversion and flattening anteroseptally.  The patient's mental status is only slightly improved in the ER but is not  anywhere close to baseline.  She will be placed in observation in a medically monitored bed for further evaluation and monitoring. PAST MEDICAL HISTORY:   Past Medical History:  Diagnosis Date  . Dementia (HCC)   . GAD (generalized anxiety disorder)   . 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 Squamous cell carcinoma     PAST SURGICAL HISTORY:  No past surgical history on file.  SOCIAL HISTORY:   Social History   Tobacco Use  . Smoking status: Never Smoker  . Smokeless tobacco: Never Used  Substance Use Topics  . Alcohol use: No    FAMILY HISTORY:  No family history on file.  DRUG ALLERGIES:  No Known Allergies  REVIEW OF SYSTEMS:   ROS As per history of present illness. All pertinent systems were reviewed above. Constitutional,  HEENT, cardiovascular, respiratory, GI, GU, musculoskeletal, neuro, psychiatric, endocrine,  integumentary and hematologic systems were reviewed and are otherwise  negative/unremarkable except for positive findings mentioned above in the HPI.   MEDICATIONS AT HOME:   Prior to Admission medications   Medication Sig Start Date End Date Taking? Authorizing Provider  alendronate (FOSAMAX) 70 MG tablet Take 70 mg by mouth once a week.    [provider]  amLODipine (NORVASC) 5 MG tablet Take 5 mg by mouth daily.    [provider]  atorvastatin (LIPITOR) 40 MG tablet Take 40 mg by mouth daily.    [provider]  calcium citrate-vitamin D (CITRACAL+D) 315-200 MG-UNIT tablet Take 1 tablet by mouth 2 (  two) times daily.    [provider]  carvedilol (COREG) 6.25 MG tablet Take 6.25 mg by mouth 2 (two) times daily with a meal.    [provider]  ENSURE (ENSURE) Take 237 mLs by mouth daily.    [provider]  magnesium oxide (MAG-OX) 400 MG tablet Take 400 mg by mouth 2 (two) times daily.     [provider]  montelukast (SINGULAIR) 10 MG tablet Take 10 mg by  mouth at bedtime.    [provider]  ondansetron (ZOFRAN) 4 MG tablet Take 1 tablet (4 mg total) by mouth every 8 (eight) hours as needed for nausea or vomiting. 03/15/18   Jeanmarie Plant, MD  polyethylene glycol (MIRALAX / GLYCOLAX) packet Take 17 g by mouth daily.    [provider]  QUEtiapine (SEROQUEL) 25 MG tablet Take 25 mg by mouth daily. At 2 pm    [provider]  QUEtiapine (SEROQUEL) 50 MG tablet Take 50 mg by mouth at bedtime.    [provider]  risperiDONE (RISPERDAL) 1 MG/ML oral solution Take 0.25 mg by mouth 2 (two) times daily.    [provider]  traZODone (DESYREL) 50 MG tablet Take 25 mg by mouth at bedtime.    [provider]  Turmeric Curcumin 500 MG CAPS Take 500 mg by mouth daily.    [provider]  valproic acid (DEPAKENE) 250 MG/5ML SOLN solution Take 250 mg by mouth 2 (two) times daily.    [provider]      VITAL SIGNS:  Blood pressure (!) 134/56, pulse 72, temperature 97.7 F (36.5 C), temperature source Oral, resp. rate 18, height 5\' 5"  (1.651 m), weight 80 kg, SpO2 97 %.  PHYSICAL EXAMINATION:  Physical Exam  GENERAL:  86 y.o.-year-old Caucasian female patient lying in the bed with no acute distress.  EYES: Pupils equal, round, reactive to light and accommodation. No scleral icterus. Extraocular muscles intact.  HEENT: Head atraumatic, normocephalic. Oropharynx and nasopharynx clear.  NECK:  Supple, no jugular venous distention. No thyroid enlargement, no tenderness.  LUNGS: Normal breath sounds bilaterally, no wheezing, rales,rhonchi or crepitation. No use of accessory muscles of respiration.  CARDIOVASCULAR: Regular rate and rhythm, S1, S2 normal. No murmurs, rubs, or gallops.  ABDOMEN: Soft, nondistended, nontender. Bowel sounds present. No organomegaly or mass.  EXTREMITIES: No pedal edema, cyanosis, or clubbing.  NEUROLOGIC: Cranial nerves II through XII are intact. Muscle  strength 5/5 in all extremities. Sensation intact. Gait not checked.  PSYCHIATRIC: The patient is disoriented X 3.  She had fair eye contact. SKIN: No obvious rash, lesion, or ulcer.   LABORATORY PANEL:   CBC Recent Labs  Lab 06/24/19 1748  WBC 4.8  HGB 13.9  HCT 41.1  PLT 167   ------------------------------------------------------------------------------------------------------------------  Chemistries  Recent Labs  Lab 06/24/19 2053  NA 142  K 4.2  CL 110  CO2 26  GLUCOSE 162*  BUN 25*  CREATININE 0.98  CALCIUM 9.7  AST 22  ALT 21  ALKPHOS 97  BILITOT 0.5   ------------------------------------------------------------------------------------------------------------------  Cardiac Enzymes No results for input(s): TROPONINI in the last 168 hours. ------------------------------------------------------------------------------------------------------------------  RADIOLOGY:  CT Head Wo Contrast  Result Date: 06/24/2019 CLINICAL DATA:  Altered mental status and dizziness. EXAM: CT HEAD WITHOUT CONTRAST TECHNIQUE: Contiguous axial images were obtained from the base of the skull through the vertex without intravenous contrast. COMPARISON:  September 15, 2015 FINDINGS: Brain: There is moderate severity cerebral atrophy with widening  of the extra-axial spaces and ventricular dilatation. There are areas of decreased attenuation within the white matter tracts of the supratentorial brain, consistent with microvascular disease changes. Vascular: No hyperdense vessel or unexpected calcification. Skull: Normal. Negative for fracture or focal lesion. Sinuses/Orbits: No acute finding. Other: None. IMPRESSION: 1. Generalized cerebral atrophy. 2. No acute intracranial abnormality. Electronically Signed   By: Virgina Norfolk M.D.   On: 06/24/2019 20:11      IMPRESSION AND PLAN:   1.  Acute encephalopathy. -The patient will be admitted to an observation medical monitored bed. -We will  follow neuro checks every 4 hours for 24 hours. -We will hold psychotropic medications for sedation or confusion. -We will obtain a portable chest x-ray. -She could be having an early UTI.  Urine culture and sensitivity will be obtained. -We will obtain a brain MRI without contrast in a.m. to assess for frontal CVA.  2.  Mild dehydration. -She will be hydrated with IV normal saline and will follow BMP.  3.  Essential hypertension. -We will continue Norvasc and Coreg.  4.  Dyslipidemia. -We will continue statin therapy and check fasting lipids.  5.  Dementia with behavioral changes. -We will continue Aricept and hold and Seroquel for sedation or confusion.  6.  Anxiety. -We will continue Xanax to be held for sedation.  7.  DVT prophylaxis. -Subcutaneous Lovenox.   All the records are reviewed and case discussed with ED provider. The plan of care was discussed in details with the patient (and family). I answered all questions. The patient agreed to proceed with the above mentioned plan. Further management will depend upon hospital course.   CODE STATUS: Full code  Status is: Observation  The patient remains OBS appropriate and will d/c before 2 midnights.  Dispo: The patient is from: SNF              Anticipated d/c is to: SNF              Anticipated d/c date is: 1 day              Patient currently is not medically stable to d/c.   TOTAL TIME TAKING CARE OF THIS PATIENT: 55 minutes.    Christel Mormon M.D on 06/24/2019 at 9:58 PM  [facility]   From 7 PM-7 AM, contact night-coverage [email or web address]  CC: Primary care physician; Kirk Ruths, MD   Note: This dictation was prepared with Dragon dictation along with smaller phrase technology. Any transcriptional errors that result from this process are unintentional.

## 2019-06-25 DIAGNOSIS — R4182 Altered mental status, unspecified: Secondary | ICD-10-CM | POA: Diagnosis present

## 2019-06-25 DIAGNOSIS — Z20822 Contact with and (suspected) exposure to covid-19: Secondary | ICD-10-CM | POA: Diagnosis present

## 2019-06-25 DIAGNOSIS — Z66 Do not resuscitate: Secondary | ICD-10-CM | POA: Diagnosis present

## 2019-06-25 DIAGNOSIS — G9341 Metabolic encephalopathy: Secondary | ICD-10-CM | POA: Diagnosis present

## 2019-06-25 DIAGNOSIS — H9190 Unspecified hearing loss, unspecified ear: Secondary | ICD-10-CM | POA: Diagnosis present

## 2019-06-25 DIAGNOSIS — E860 Dehydration: Secondary | ICD-10-CM | POA: Diagnosis present

## 2019-06-25 DIAGNOSIS — T461X5A Adverse effect of calcium-channel blockers, initial encounter: Secondary | ICD-10-CM | POA: Diagnosis present

## 2019-06-25 DIAGNOSIS — F0151 Vascular dementia with behavioral disturbance: Secondary | ICD-10-CM | POA: Diagnosis present

## 2019-06-25 DIAGNOSIS — F0391 Unspecified dementia with behavioral disturbance: Secondary | ICD-10-CM | POA: Diagnosis present

## 2019-06-25 DIAGNOSIS — Z85828 Personal history of other malignant neoplasm of skin: Secondary | ICD-10-CM | POA: Diagnosis not present

## 2019-06-25 DIAGNOSIS — T447X5A Adverse effect of beta-adrenoreceptor antagonists, initial encounter: Secondary | ICD-10-CM | POA: Diagnosis present

## 2019-06-25 DIAGNOSIS — F03918 Unspecified dementia, unspecified severity, with other behavioral disturbance: Secondary | ICD-10-CM | POA: Diagnosis present

## 2019-06-25 DIAGNOSIS — R55 Syncope and collapse: Secondary | ICD-10-CM | POA: Diagnosis present

## 2019-06-25 DIAGNOSIS — G4700 Insomnia, unspecified: Secondary | ICD-10-CM | POA: Diagnosis present

## 2019-06-25 DIAGNOSIS — I10 Essential (primary) hypertension: Secondary | ICD-10-CM | POA: Diagnosis present

## 2019-06-25 DIAGNOSIS — Z79899 Other long term (current) drug therapy: Secondary | ICD-10-CM | POA: Diagnosis not present

## 2019-06-25 DIAGNOSIS — E785 Hyperlipidemia, unspecified: Secondary | ICD-10-CM | POA: Diagnosis present

## 2019-06-25 DIAGNOSIS — Z7983 Long term (current) use of bisphosphonates: Secondary | ICD-10-CM | POA: Diagnosis not present

## 2019-06-25 DIAGNOSIS — I959 Hypotension, unspecified: Secondary | ICD-10-CM | POA: Diagnosis present

## 2019-06-25 DIAGNOSIS — F411 Generalized anxiety disorder: Secondary | ICD-10-CM | POA: Diagnosis present

## 2019-06-25 LAB — BASIC METABOLIC PANEL
Anion gap: 7 (ref 5–15)
BUN: 21 mg/dL (ref 8–23)
CO2: 24 mmol/L (ref 22–32)
Calcium: 9.4 mg/dL (ref 8.9–10.3)
Chloride: 110 mmol/L (ref 98–111)
Creatinine, Ser: 0.89 mg/dL (ref 0.44–1.00)
GFR calc Af Amer: >60 mL/min (ref >60)
GFR calc non Af Amer: 59 mL/min — ABNORMAL LOW (ref >60)
Glucose, Bld: 94 mg/dL (ref 70–99)
Potassium: 3.9 mmol/L (ref 3.5–5.1)
Sodium: 141 mmol/L (ref 135–145)

## 2019-06-25 LAB — CBC
HCT: 39.4 % (ref 36.0–46.0)
Hemoglobin: 13.3 g/dL (ref 12.0–15.0)
MCH: 33.5 pg (ref 26.0–34.0)
MCHC: 33.8 g/dL (ref 30.0–36.0)
MCV: 99.2 fL (ref 80.0–100.0)
Platelets: 141 10*3/uL — ABNORMAL LOW (ref 150–400)
RBC: 3.97 MIL/uL (ref 3.87–5.11)
RDW: 12.9 % (ref 11.5–15.5)
WBC: 6.8 10*3/uL (ref 4.0–10.5)
nRBC: 0 % (ref 0.0–0.2)

## 2019-06-25 LAB — LIPID PANEL
Cholesterol: 174 mg/dL (ref 0–200)
HDL: 40 mg/dL — ABNORMAL LOW (ref >40)
LDL Cholesterol: 117 mg/dL — ABNORMAL HIGH (ref 0–99)
Total CHOL/HDL Ratio: 4.4 RATIO
Triglycerides: 84 mg/dL (ref <150)
VLDL: 17 mg/dL (ref 0–40)

## 2019-06-25 LAB — SARS CORONAVIRUS 2 (TAT 6-24 HRS): SARS Coronavirus 2: NEGATIVE

## 2019-06-25 MED ORDER — SODIUM CHLORIDE 0.9 % IV BOLUS
500.0000 mL | Freq: Once | INTRAVENOUS | Status: AC
  Administered 2019-06-25: 500 mL via INTRAVENOUS

## 2019-06-25 NOTE — ED Notes (Signed)
Pt given lunch tray.

## 2019-06-25 NOTE — ED Notes (Signed)
Pt continuously removes cardiac leads and monitoring devices. Pt is confused. Purewick explained to pt, call bell explained to pt, music turned on for pt.

## 2019-06-25 NOTE — ED Notes (Signed)
IV's in right arm secured with gauze wrap to prevent pt from pulling IV's out.

## 2019-06-25 NOTE — ED Notes (Signed)
Ready bed @1318 

## 2019-06-25 NOTE — Progress Notes (Signed)
OR for prayer. Patient was sleeping, daughter Rosey Bath was at bedside. Joint visit with Pamala Duffel. Prayed for patient. Was able to provide comfort for daughter with coffee and as blanket for a cold room. Assured daughter we are accessible if and when needed.

## 2019-06-25 NOTE — Progress Notes (Addendum)
PROGRESS NOTE    [name]   RWE:[id number]  DOB: 12-10-1931  PCP: Lauro Regulus, MD    DOA: 06/24/2019 LOS: 0   Brief Narrative   [name]  is a 86 y.o. Caucasian female with a known history of multi-infarct dementia with behavioral changes, hypertension, peripheral vascular disease, GERD, and anxiety disorder who at baseline is ambulatory and verbally interactive at [facility].  She presented to the ED on 4/11 after an episode of acute onset dizziness while ambulating in the hallway.  Staff also reported confusion and and difficulty communicating.  At baseline she ambulates with a walker and has fluent speech and baseline confusion.  In the ED, hypertensive 168/63.  Labs mostly unremarkable except glucose 162 and BUN 25.  UA showed 6-10 WBCs, rare bacteria, moderate leukocytes.  Head CT showed chronic findings of generalized atrophy and chronic small vessel disease but nothing acute.  No acute changes on EKG which was normal sinus rhythm 81 bpm.  Patient admitted for observation and further evaluation.    Assessment & Plan   Principal Problem:   Pre-syncope Active Problems:   Acute metabolic encephalopathy   Dementia with behavioral disturbance (HCC)  Presyncope -etiology to be determined but patient's blood pressure trend has been labile with very soft diastolics.  Suspect hypotension or orthostasis.  CT head and MRI brain both negative for anything acute. --Orthostatic vitals --Telemetry monitoring --Neuro checks --Fall precautions --PT evaluation --Added to support blood pressure, maintain MAP > 65  Mild dehydration -likely contributed to her presyncopal symptoms and BUN elevated.  Patient was rehydrated on admission.  Use maintenance fluids if poor p.o. intake or to maintain blood pressure if needed.  Monitor electrolytes.  Essential hypertension -labile blood pressures since admission.  Was hypertensive on admission, this afternoon  hypotensive 97/39 improved with small fluid bolus.  Continue home amlodipine and Coreg with hold parameters for SBP<or DBP<70.  Hyperlipidemia -continue home statin  Dementia with behavioral disturbances -continue home Aricept.  Seroquel held due to confusion on admission.  Will resume as soon as appropriate.  Small dose as needed Haldol IV can be used as needed.  Anxiety -Xanax held  Patient BMI: Body mass index is 29.35 kg/m.   DVT prophylaxis: Lovenox Diet:  Diet Orders (From admission, onward)    Start     Ordered   06/24/19 2155  Diet Heart Room service appropriate? Yes; Fluid consistency: Thin  Diet effective now    Question Answer Comment  Room service appropriate? Yes   Fluid consistency: Thin      06/24/19 2156            Code Status: DNR    Subjective 06/25/19    Patient seen in the ED on hold for bed, daughter at bedside.  Daughter reports patient's mentation seems to be near her baseline.  Reports she is very hard of hearing.  Patient in no distress and pleasant, denies acute complaints although is poor historian.   Disposition Plan & Communication   Dispo & Barriers: Patient hypotensive this afternoon and dizzy again.  Requires further hospital stay for close monitoring, evaluation and management as above as patient is not at baseline functional status Coming from: [facility] Exp d/c date: 4/13-14 Medically stable for d/c?  No  Severity of Illness: The appropriate patient status for this patient is INPATIENT. It is not anticipated that the patient will be medically stable for discharge from the hospital within 2 midnights  of admission.  The following factors support the patient status of inpatient: --Presenting symptoms include  dizziness, difficulty communicating, confusion. --Worrisome physical exam findings include  labile blood pressure with episodes of hypotension. --Initial radiographic and laboratory data are worrisome because of  elevated BUN  consistent with dehydration. --Chronic co-morbidities include  dementia with behavioral disturbances, hypertension, hyperlipidemia. Continue hospital stay at this time is warranted because patient's blood pressure is not stable and she returned to her functional baseline.  Family Communication: daughter at bedside during encounter, updated, questions answered and she agrees with the plan.    Consults, Procedures, Significant Events   None  Objective   Vitals:   06/25/19 1600 06/25/19 1630 06/25/19 1700 06/25/19 1800  BP: (!) 101/39 (!) 121/43 (!) 108/35 124/81  Pulse: (!) 50 (!) 52 (!) 51 (!) 56  Resp: 14 13 14 19   Temp:      TempSrc:      SpO2: 95% 96% 97% 97%  Weight:      Height:        Intake/Output Summary (Last 24 hours) at 06/25/2019 2237 Last data filed at 06/25/2019 1800 Gross per 24 hour  Intake 1690.11 ml  Output 650 ml  Net 1040.11 ml   Filed Weights   06/24/19 1743  Weight: 80 kg    Physical Exam:  General exam: awake, alert, no acute distress HEENT: moist mucus membranes, hearing grossly normal, hearing impaired Respiratory system: CTAB, no wheezes, rales or rhonchi, normal respiratory effort. Cardiovascular system: normal S1/S2, RRR, nonpitting lower extremity edema.   Gastrointestinal system: soft, NT, ND, +bowel sounds. Central nervous system: Alert.  Exam limited by cognitive deficits due to dementia and patient does not follow commands or answer questions reliably.  No gross focal neurologic deficits, normal speech Extremities: moves all, no cyanosis, normal tone Skin: dry, intact, normal temperature Psychiatry: normal pleasant mood, congruent affect  Labs   Data Reviewed: I have personally reviewed following labs and imaging studies  CBC: Recent Labs  Lab 06/24/19 1748 06/25/19 0607  WBC 4.8 6.8  NEUTROABS 3.1  --   HGB 13.9 13.3  HCT 41.1 39.4  MCV 98.6 99.2  PLT 167 141*   Basic Metabolic Panel: Recent Labs  Lab 06/24/19 2053  06/25/19 0607  NA 142 141  K 4.2 3.9  CL 110 110  CO2 26 24  GLUCOSE 162* 94  BUN 25* 21  CREATININE 0.98 0.89  CALCIUM 9.7 9.4   GFR: Estimated Creatinine Clearance: 47.4 mL/min (by C-G formula based on SCr of 0.89 mg/dL). Liver Function Tests: Recent Labs  Lab 06/24/19 2053  AST 22  ALT 21  ALKPHOS 97  BILITOT 0.5  PROT 6.6  ALBUMIN 3.5   No results for input(s): LIPASE, AMYLASE in the last 168 hours. No results for input(s): AMMONIA in the last 168 hours. Coagulation Profile: No results for input(s): INR, PROTIME in the last 168 hours. Cardiac Enzymes: No results for input(s): CKTOTAL, CKMB, CKMBINDEX, TROPONINI in the last 168 hours. BNP (last 3 results) No results for input(s): PROBNP in the last 8760 hours. HbA1C: No results for input(s): HGBA1C in the last 72 hours. CBG: No results for input(s): GLUCAP in the last 168 hours. Lipid Profile: Recent Labs    06/25/19 0607  CHOL 174  HDL 40*  LDLCALC 117*  TRIG 84  CHOLHDL 4.4   Thyroid Function Tests: No results for input(s): TSH, T4TOTAL, FREET4, T3FREE, THYROIDAB in the last 72 hours. Anemia Panel: No results for input(s):  VITAMINB12, FOLATE, FERRITIN, TIBC, IRON, RETICCTPCT in the last 72 hours. Sepsis Labs: No results for input(s): PROCALCITON, LATICACIDVEN in the last 168 hours.  Recent Results (from the past 240 hour(s))  SARS CORONAVIRUS 2 (TAT 6-24 HRS) Nasopharyngeal Nasopharyngeal Swab     Status: None   Collection Time: 06/24/19  7:42 AM   Specimen: Nasopharyngeal Swab  Result Value Ref Range Status   SARS Coronavirus 2 NEGATIVE NEGATIVE Final    Comment: (NOTE) SARS-CoV-2 target nucleic acids are NOT DETECTED. The SARS-CoV-2 RNA is generally detectable in upper and lower respiratory specimens during the acute phase of infection. Negative results do not preclude SARS-CoV-2 infection, do not rule out co-infections with other pathogens, and should not be used as the sole basis for treatment  or other patient management decisions. Negative results must be combined with clinical observations, patient history, and epidemiological information. The expected result is Negative. Fact Sheet for Patients: HairSlick.no Fact Sheet for Healthcare Providers: quierodirigir.com This test is not yet approved or cleared by the [place] FDA and  has been authorized for detection and/or diagnosis of SARS-CoV-2 by FDA under an Emergency Use Authorization (EUA). This EUA will remain  in effect (meaning this test can be used) for the duration of the COVID-19 declaration under Section 56 4(b)(1) of the Act, 21 U.S.C. section 360bbb-3(b)(1), unless the authorization is terminated or revoked sooner. Performed at [facility], [address] [place], [place] [address]       Imaging Studies   CT Head Wo Contrast  Result Date: 06/24/2019 CLINICAL DATA:  Altered mental status and dizziness. EXAM: CT HEAD WITHOUT CONTRAST TECHNIQUE: Contiguous axial images were obtained from the base of the skull through the vertex without intravenous contrast. COMPARISON:  September 15, 2015 FINDINGS: Brain: There is moderate severity cerebral atrophy with widening of the extra-axial spaces and ventricular dilatation. There are areas of decreased attenuation within the white matter tracts of the supratentorial brain, consistent with microvascular disease changes. Vascular: No hyperdense vessel or unexpected calcification. Skull: Normal. Negative for fracture or focal lesion. Sinuses/Orbits: No acute finding. Other: None. IMPRESSION: 1. Generalized cerebral atrophy. 2. No acute intracranial abnormality. Electronically Signed   By: Aram Candela M.D.   On: 06/24/2019 20:11   MR BRAIN WO CONTRAST  Result Date: 06/25/2019 CLINICAL DATA:  Initial evaluation for acute encephalopathy. EXAM: MRI HEAD WITHOUT CONTRAST TECHNIQUE: Multiplanar, multiecho pulse sequences  of the brain and surrounding structures were obtained without intravenous contrast. COMPARISON:  Prior head CT from 06/24/2019. FINDINGS: Brain: Moderately advanced temporal lobe predominant cerebral atrophy. Associated moderate chronic microvascular ischemic disease seen involving the supratentorial cerebral white matter. No abnormal foci of restricted diffusion to suggest acute or subacute ischemia. Gray-white matter differentiation maintained. No encephalomalacia to suggest chronic cortical infarction. No foci of susceptibility artifact to suggest acute or chronic intracranial hemorrhage. No mass lesion, midline shift or mass effect. Diffuse ventricular prominence related global parenchymal volume loss of hydrocephalus. No extra-axial fluid collection. Vascular: Major intracranial vascular flow voids are maintained. Skull and upper cervical spine: Craniocervical junction normal. Upper cervical spine within normal limits. Bone marrow signal intensity normal. No scalp soft tissue abnormality. Sinuses/Orbits: Patient status post bilateral ocular lens replacement. Paranasal sinuses are largely clear. No mastoid effusion. Other: None. IMPRESSION: 1. No acute intracranial abnormality. 2. Moderately advanced cerebral atrophy with chronic small vessel ischemic disease. Electronically Signed   By: Rise Mu M.D.   On: 06/25/2019 00:42     Medications  Scheduled Meds: . amLODipine  5 mg Oral Daily  . aspirin EC  81 mg Oral Daily  . atorvastatin  40 mg Oral Daily  . calcium-vitamin D  1 tablet Oral BID  . carvedilol  6.25 mg Oral BID WC  . enoxaparin (LOVENOX) injection  40 mg Subcutaneous Q24H  . feeding supplement (ENSURE ENLIVE)  237 mL Oral Daily  . magnesium oxide  400 mg Oral BID  . montelukast  10 mg Oral QHS  . polyethylene glycol  17 g Oral Daily  . QUEtiapine  50 mg Oral QHS  . risperiDONE  0.25 mg Oral BID  . valproic acid  250 mg Oral BID   Continuous Infusions: . sodium  chloride 100 mL/hr at 06/25/19 2044       LOS: 0 days    Time spent: 45 minutes    Ezekiel Slocumb, DO [facility]   If 7PM-7AM, please contact night-coverage [email or web address] 06/25/2019, 10:37 PM

## 2019-06-25 NOTE — Progress Notes (Signed)
PT Cancellation Note  Patient Details Name: [name] MRN: [id number] DOB: 13-Sep-1931   Cancelled Treatment:    Reason Eval/Treat Not Completed: Medical issues which prohibited therapy(Chart reviewed, RN consulted. RN asks to hold PT evaluation at this time due to low BP. Will attempt again at later date/time once appropriate.)  4:48 PM, 06/25/19 Rosamaria Lints, PT, DPT Physical Therapist - [facility]  [phone] (ASCOM)    [name] 06/25/2019, 4:48 PM

## 2019-06-25 NOTE — ED Notes (Signed)
Report to Katie, RN

## 2019-06-25 NOTE — ED Notes (Addendum)
Pt clean and dry at this time. Daughter stepped outside. purewick on patient.

## 2019-06-25 NOTE — Progress Notes (Addendum)
Patient arrived on floor from ER. Breathing is even and unlabored. No distress noted. Patient is pleasantly confused. DNR paperwork brought with patient, placed in chart and purple DNR wristband placed on patient's wrist. Admission complete with patient's daughter who is MPOA. Alerted Dr. Denton Lank that patient's BP is 97/39 (56) HR is 54. Patient is not dizzy but is fatigued (easily aroused). Patient's daughter states this is normal for patient. Dr. Denton Lank stated to monitor patient for now. Hold 1700 coreg. Will continue to monitor and will initiate frequent vitals.

## 2019-06-25 NOTE — ED Notes (Signed)
This tech and Jonny Ruiz, EDT changed pts linen; new linen, brief, chucks and purewick placed. Pt provided with warm blankets and resting with daughter at bedside. No other needs voiced at this time.

## 2019-06-25 NOTE — ED Notes (Signed)
Taken to floor by transport. Daughter with patient to floor. Pt left with all of belongings including DNR paperwork.

## 2019-06-26 LAB — BASIC METABOLIC PANEL
Anion gap: 7 (ref 5–15)
BUN: 18 mg/dL (ref 8–23)
CO2: 27 mmol/L (ref 22–32)
Calcium: 9.9 mg/dL (ref 8.9–10.3)
Chloride: 112 mmol/L — ABNORMAL HIGH (ref 98–111)
Creatinine, Ser: 0.79 mg/dL (ref 0.44–1.00)
GFR calc Af Amer: >60 mL/min (ref >60)
GFR calc non Af Amer: >60 mL/min (ref >60)
Glucose, Bld: 93 mg/dL (ref 70–99)
Potassium: 4.4 mmol/L (ref 3.5–5.1)
Sodium: 146 mmol/L — ABNORMAL HIGH (ref 135–145)

## 2019-06-26 NOTE — Evaluation (Signed)
Physical Therapy Evaluation Patient Details Name: [name] MRN: [id number] DOB: 09-Jan-1932 Today's Date: 06/26/2019   History of Present Illness  [name] is an 88yoF who comes [facility] 4/11 from [facility] c AMS and onset dizziness. PLOF: ambulatory, verbally interactive and appropriate. PMH: dementia, HTN, PVDm GERD, GAD.  Clinical Impression  Pt admitted with above diagnosis. Pt currently with functional limitations due to the deficits listed below (see "PT Problem List"). Upon entry, pt in bed, awake and smiling, greets Chartered loss adjuster with a wave/eye contact, but does nto verbalize until later in session and only minimally at that point. NA in room just finished bathing patient. Orthosatic vitals negative from supine to sitting, 150s SBP both times- pt is not able to reliably relay symptoms. MaxA for bed mobility with delayed inititation, does better with tactile cues and hand-over-hand. Minguard Assist for several STS transfers, which require max effort and multiple false starts due to weakness. Pt able to AMB to doorway and back but has safety awareness issues in gait, and poor problem solving with navigating obstacles in room. Limited following of simple cues for back to supine (~50% of multimodal cues.) Pt reports to be hungry at end of session when asked. Pt repositioned for comfort, upright sitting in bed, meal tray presented, pt answers basic questions regarding meal preference, seasoning for eggs, cream/sugar for coffee, HOH clearly contributing to communication deficits. Pt able to self feed ~25% of plate while author is in room ending session, appears comfortable. Lines/leads tucked out of visual field for safety. Functional mobility assessment demonstrates increased effort/time requirements, poor tolerance, and need for physical assistance, whereas the patient performed these at a higher level of independence PTA. Additional AMB likely possible, but dues to safety issues and poor  communication of subjective feedback, distance is limited for safety. Pt will need supervision for OOB mobility at DC. Pt will benefit from skilled PT intervention to increase independence and safety with basic mobility in preparation for discharge to the venue listed below.       Follow Up Recommendations SNF;Supervision - Intermittent;Supervision for mobility/OOB;Other (comment)(If in ALF setting, may be more appropriate at a higher level of care or memory care at this point.)    Equipment Recommendations  None recommended by PT    Recommendations for Other Services       Precautions / Restrictions Precautions Precautions: Fall Precaution Comments: safety mittens Restrictions Weight Bearing Restrictions: No      Mobility  Bed Mobility Overal bed mobility: Needs Assistance Bed Mobility: Supine to Sit;Sit to Supine     Supine to sit: Max assist;+2 for physical assistance Sit to supine: Max assist;+2 for physical assistance   General bed mobility comments: difficult to rate, pt does not inititate movement, helps minimally, although body language suggests she is aware of what is going on  Transfers Overall transfer level: Needs assistance Equipment used: Rolling walker (2 wheeled) Transfers: Sit to/from Stand Sit to Stand: Supervision         General transfer comment: maximal effort and multiple attempts  Ambulation/Gait Ambulation/Gait assistance: Min guard Gait Distance (Feet): 40 Feet Assistive device: Rolling walker (2 wheeled) Gait Pattern/deviations: WFL(Within Functional Limits)     General Gait Details: Difficulty following commands for mobility in room, although apears steady with RW, safety is limited due to cognition/HOH and obtacles in room.  Stairs            Wheelchair Mobility    Modified Rankin (Stroke Patients Only)  Balance Overall balance assessment: Needs assistance Sitting-balance support: No upper extremity supported;Feet  supported Sitting balance-Leahy Scale: Good     Standing balance support: During functional activity;Bilateral upper extremity supported Standing balance-Leahy Scale: Fair Standing balance comment: Has posterior lean with standing both times. But able to correct and progress to AMB.                             Pertinent Vitals/Pain Pain Assessment: No/denies pain    Home Living Family/patient expects to be discharged to:: Assisted living               Home Equipment: Walker - 2 wheels      Prior Function Level of Independence: Needs assistance   Gait / Transfers Assistance Needed: AMB facility ad lib at baseline           Hand Dominance        Extremity/Trunk Assessment   Upper Extremity Assessment Upper Extremity Assessment: Generalized weakness    Lower Extremity Assessment Lower Extremity Assessment: Generalized weakness       Communication      Cognition Arousal/Alertness: Awake/alert Behavior During Therapy: WFL for tasks assessed/performed Overall Cognitive Status: History of cognitive impairments - at baseline                                 General Comments: difficulty following commands      General Comments      Exercises     Assessment/Plan    PT Assessment Patient needs continued PT services  PT Problem List Decreased strength;Decreased activity tolerance;Decreased balance;Decreased mobility;Decreased cognition;Decreased knowledge of use of DME;Decreased safety awareness;Decreased knowledge of precautions       PT Treatment Interventions Balance training;DME instruction;Gait training;Stair training;Functional mobility training;Therapeutic activities;Therapeutic exercise;Patient/family education;Cognitive remediation    PT Goals (Current goals can be found in the Care Plan section)  Acute Rehab PT Goals PT Goal Formulation: Patient unable to participate in goal setting Time For Goal Achievement:  07/10/19 Potential to Achieve Goals: Fair    Frequency Min 2X/week   Barriers to discharge        Co-evaluation               AM-PAC PT "6 Clicks" Mobility  Outcome Measure Help needed turning from your back to your side while in a flat bed without using bedrails?: A Lot Help needed moving from lying on your back to sitting on the side of a flat bed without using bedrails?: Total Help needed moving to and from a bed to a chair (including a wheelchair)?: A Little Help needed standing up from a chair using your arms (e.g., wheelchair or bedside chair)?: A Little Help needed to walk in hospital room?: A Little Help needed climbing 3-5 steps with a railing? : A Lot 6 Click Score: 14    End of Session Equipment Utilized During Treatment: Gait belt Activity Tolerance: Patient tolerated treatment well Patient left: in bed;with call bell/phone within reach;Other (comment)(set up breakfast for patient) Nurse Communication: Mobility status PT Visit Diagnosis: History of falling (Z91.81);Unsteadiness on feet (R26.81);Other abnormalities of gait and mobility (R26.89)    Time: 3664-4034 PT Time Calculation (min) (ACUTE ONLY): 30 min   Charges:   PT Evaluation $PT Eval Moderate Complexity: 1 Mod PT Treatments $Therapeutic Exercise: 8-22 mins       11:21 AM, 06/26/19 Etta Grandchild, PT,  DPT Physical Therapist - [facility]  [phone] (ASCOM)    Shemekia Patane C 06/26/2019, 11:16 AM

## 2019-06-26 NOTE — Progress Notes (Signed)
Patient transferred to [facility] via EMS. Right forearm PIV D/C'd prior to discharge. Denied pain and no acute distress noted.

## 2019-06-26 NOTE — TOC Initial Note (Signed)
Transition of Care [facility]) - Initial/Assessment Note    Patient Details  Name: [name] MRN: [id number] Date of Birth: [birth year]  Transition of [facility]) CM/SW Contact:    Trenton Founds, RN Phone Number: 06/26/2019, 4:03 PM  Clinical Narrative:   RNCM attempted to contact and left VM for daughter. She returned call and this CM informed of reason for call and to discuss patient's discharge planning. Patient is currently a resident of [facility] memory care unit and daughter reports that it is her plan for patient to return there with the Hospice services that she already has. She requested to speak with MD. This CM messaged MD and requested she call daughter.  Received return message a short time later from MD reporting that patient will be discharged back to facility today. Placed call to facility and spoke with Tyler Aas who reports it will be fine for patient to return today however she will need transport there. RNCM completed FL2, got signed by MD and faxed to [facility] at [phone]. EMS paperwork completed and printed off on the floor. Spoke with Licensed conveyancer and informed of same.  EMS will be called for transport once bedside nurse is ready.                 Expected Discharge Plan: Assisted Living Barriers to Discharge: Barriers Resolved   Patient Goals and CMS Choice        Expected Discharge Plan and Services Expected Discharge Plan: Assisted Living   Discharge Planning Services: CM Consult   Living arrangements for the past 2 months: Assisted Living Facility Expected Discharge Date: 06/26/19                                    Prior Living Arrangements/Services Living arrangements for the past 2 months: Assisted Living Facility Lives with:: Facility Resident                   Activities of Daily Living Home Assistive Devices/Equipment: Environmental consultant (specify type), Shower chair with back ADL Screening (condition at time of admission) Patient's  cognitive ability adequate to safely complete daily activities?: No Is the patient deaf or have difficulty hearing?: Yes Does the patient have difficulty seeing, even when wearing glasses/contacts?: Yes Does the patient have difficulty concentrating, remembering, or making decisions?: Yes Patient able to express need for assistance with ADLs?: Yes Does the patient have difficulty dressing or bathing?: Yes Independently performs ADLs?: No Communication: Needs assistance Is this a change from baseline?: Pre-admission baseline Dressing (OT): Needs assistance Is this a change from baseline?: Pre-admission baseline Grooming: Needs assistance Is this a change from baseline?: Pre-admission baseline Feeding: Needs assistance Is this a change from baseline?: Pre-admission baseline Bathing: Needs assistance Is this a change from baseline?: Pre-admission baseline Toileting: Needs assistance Is this a change from baseline?: Pre-admission baseline In/Out Bed: Needs assistance Is this a change from baseline?: Pre-admission baseline Walks in Home: Needs assistance Is this a change from baseline?: Pre-admission baseline Does the patient have difficulty walking or climbing stairs?: Yes Weakness of Legs: Both Weakness of Arms/Hands: Right(previous break of right arm)  Permission Sought/Granted                  Emotional Assessment              Admission diagnosis:  Altered mental status, unspecified altered mental status type [R41.82] AMS (altered mental status) [  R41.82] Pre-syncope [R55] Patient Active Problem List   Diagnosis Date Noted  . Pre-syncope 06/25/2019  . Acute metabolic encephalopathy 03/03/7587  . Dementia with behavioral disturbance ([place]) 06/25/2019   PCP:  Housecalls, [place]:  No Pharmacies Listed    Social Determinants of Health (SDOH) Interventions    Readmission Risk Interventions No flowsheet data found.

## 2019-06-26 NOTE — Discharge Summary (Signed)
Physician Discharge Summary  [name] JSE:[id number] DOB: [birth year] DOA: 06/24/2019  PCP: Almetta Lovely, [facility]  Admit date: 06/24/2019 Discharge date: 06/26/2019  Admitted From: [facility] Disposition:  [facility]  Recommendations for Outpatient Follow-up:  1. Follow up with PCP in 1-2 weeks 2. Please obtain BMP/CBC in one week 3. IMPORTANT: Check patient's blood pressure before giving amlodipine.  If systolic BP less than 130 or diastolic less than 70, then HOLD amlodipine 4. Coreg was stopped because patient was bradycardic and BP too low.  She had several BP's with diastolic pressures in 30's and 40's.  HR remained mostly in low 50's.    Home Health: No  Equipment/Devices: None   Discharge Condition: Stable  CODE STATUS: DNR  Diet recommendation: Heart Healthy  Brief/Interim Summary:  MaryGuinnis a88 y.o.Caucasian femalewith a known history ofmulti-infarct dementia with behavioral changes, hypertension, peripheral vascular disease, GERD, and anxiety disorder who at baseline is ambulatory and verbally interactive at [facility].  She presented to the ED on 4/11 after an episode of acute onset dizziness while ambulating in the hallway.  Staff also reported confusion and and difficulty communicating.  At baseline she ambulates with a walker and has fluent speech and baseline confusion.  In the ED, hypertensive 168/63.  Labs mostly unremarkable except glucose 162 and BUN 25.  UA showed 6-10 WBCs, rare bacteria, moderate leukocytes.  Head CT showed chronic findings of generalized atrophy and chronic small vessel disease but nothing acute.  No acute changes on EKG which was normal sinus rhythm 81 bpm.  Patient admitted for observation and further evaluation.  Presyncope - etiology appears to be overtreatment of hypertension with combination of Coreg and Norvasc.  Patient frequently had diastolic pressures in the 30s and 40s along with heart  rate in the low 50s.  These findings fit the clinical picture of her transient dizziness and difficulty communicating.  Orthostatic vitals were negative.  CT head and MRI brain both negative for anything acute.  No arrhythmias seen on telemetry monitoring.  No focal neurologic deficits seen on exam neurochecks PT evaluated patient, recommended SNF versus supervised mobility at.  Mild dehydration -likely contributed to her presyncopal symptoms and BUN elevated.  Patient was rehydrated with IV fluids.    Essential hypertension - labile blood pressures since admission with low diastolics.  Afternoon on 4/12, hypotensive episode 97/39 which improved with small fluid bolus.  It appears patient had received Coreg at around 1 AM and then again with morning medications which likely caused hypotension. --Continue amlodipine 5 mg daily but hold if systolic less than 130 or diastolic less than 70 --Stop Coreg  Hyperlipidemia -continue home statin  Dementia with behavioral disturbances -resume home medications on discharge  Anxiety -resume Xanax on discharge   Patient BMI: Body mass index is 29.35 kg/m.    Discharge Diagnoses: Principal Problem:   Pre-syncope Active Problems:   Acute metabolic encephalopathy   Dementia with behavioral disturbance [facility])    Discharge Instructions   Discharge Instructions    Call MD for:  extreme fatigue   Complete by: As directed    Call MD for:  persistant dizziness or light-headedness   Complete by: As directed    Call MD for:  persistant nausea and vomiting   Complete by: As directed    Call MD for:  temperature >100.4   Complete by: As directed    Diet - low sodium heart healthy   Complete by: As directed  Increase activity slowly   Complete by: As directed      Allergies as of 06/26/2019   No Known Allergies     Medication List    STOP taking these medications   carvedilol 6.25 MG tablet Commonly known as: COREG     TAKE these  medications   acetaminophen 325 MG tablet Commonly known as: TYLENOL Take 650 mg by mouth 3 (three) times daily as needed.   alendronate 70 MG tablet Commonly known as: FOSAMAX Take 70 mg by mouth once a week.   amLODipine 5 MG tablet Commonly known as: NORVASC Take 5 mg by mouth daily.   atorvastatin 40 MG tablet Commonly known as: LIPITOR Take 40 mg by mouth daily.   calcium citrate-vitamin D 315-200 MG-UNIT tablet Commonly known as: CITRACAL+D Take 1 tablet by mouth 2 (two) times daily.   donepezil 10 MG tablet Commonly known as: ARICEPT Take 10 mg by mouth at bedtime.   Ensure Take 237 mLs by mouth daily.   guaifenesin 100 MG/5ML syrup Commonly known as: ROBITUSSIN Take 200 mg by mouth 3 (three) times daily as needed for cough.   hyoscyamine 0.125 MG tablet Commonly known as: LEVSIN Take 0.125 mg by mouth every 3 (three) hours as needed.   loperamide 2 MG capsule Commonly known as: IMODIUM Take 2 mg by mouth as needed for diarrhea or loose stools.   LORazepam 0.5 MG tablet Commonly known as: ATIVAN Take 0.5 mg by mouth every 4 (four) hours as needed for anxiety.   magnesium oxide 400 MG tablet Commonly known as: MAG-OX Take 400 mg by mouth 2 (two) times daily.   montelukast 10 MG tablet Commonly known as: SINGULAIR Take 10 mg by mouth at bedtime.   neomycin-bacitracin-polymyxin [id number] ointment Apply 1 application topically 4 (four) times daily.   nystatin powder Generic drug: nystatin Apply 1 application topically 2 (two) times daily.   OLANZapine 2.5 MG tablet Commonly known as: ZYPREXA Take 1.25 mg by mouth daily.   omeprazole 20 MG capsule Commonly known as: PRILOSEC Take 20 mg by mouth daily.   ondansetron 4 MG tablet Commonly known as: Zofran Take 1 tablet (4 mg total) by mouth every 8 (eight) hours as needed for nausea or vomiting.   polyethylene glycol 17 g packet Commonly known as: MIRALAX / GLYCOLAX Take 17 g by mouth daily.    QUEtiapine 25 MG tablet Commonly known as: SEROQUEL Take 25 mg by mouth daily. At 2 pm   QUEtiapine 50 MG tablet Commonly known as: SEROQUEL Take 50 mg by mouth at bedtime.   risperiDONE 1 MG/ML oral solution Commonly known as: RISPERDAL Take 0.25 mg by mouth 2 (two) times daily.   saccharomyces boulardii 250 MG capsule Commonly known as: FLORASTOR Take 250 mg by mouth 2 (two) times daily.   traZODone 50 MG tablet Commonly known as: DESYREL Take 25 mg by mouth at bedtime.   Turmeric Curcumin 500 MG Caps Take 500 mg by mouth daily.   valproic acid 250 MG/5ML solution Commonly known as: DEPAKENE Take 250 mg by mouth 2 (two) times daily.       No Known Allergies  Consultations:  none    Procedures/Studies: CT Head Wo Contrast  Result Date: 06/24/2019 CLINICAL DATA:  Altered mental status and dizziness. EXAM: CT HEAD WITHOUT CONTRAST TECHNIQUE: Contiguous axial images were obtained from the base of the skull through the vertex without intravenous contrast. COMPARISON:  September 15, 2015 FINDINGS: Brain: There is moderate severity cerebral  atrophy with widening of the extra-axial spaces and ventricular dilatation. There are areas of decreased attenuation within the white matter tracts of the supratentorial brain, consistent with microvascular disease changes. Vascular: No hyperdense vessel or unexpected calcification. Skull: Normal. Negative for fracture or focal lesion. Sinuses/Orbits: No acute finding. Other: None. IMPRESSION: 1. Generalized cerebral atrophy. 2. No acute intracranial abnormality. Electronically Signed   By: Aram Candela M.D.   On: 06/24/2019 20:11   MR BRAIN WO CONTRAST  Result Date: 06/25/2019 CLINICAL DATA:  Initial evaluation for acute encephalopathy. EXAM: MRI HEAD WITHOUT CONTRAST TECHNIQUE: Multiplanar, multiecho pulse sequences of the brain and surrounding structures were obtained without intravenous contrast. COMPARISON:  Prior head CT from  06/24/2019. FINDINGS: Brain: Moderately advanced temporal lobe predominant cerebral atrophy. Associated moderate chronic microvascular ischemic disease seen involving the supratentorial cerebral white matter. No abnormal foci of restricted diffusion to suggest acute or subacute ischemia. Gray-white matter differentiation maintained. No encephalomalacia to suggest chronic cortical infarction. No foci of susceptibility artifact to suggest acute or chronic intracranial hemorrhage. No mass lesion, midline shift or mass effect. Diffuse ventricular prominence related global parenchymal volume loss of hydrocephalus. No extra-axial fluid collection. Vascular: Major intracranial vascular flow voids are maintained. Skull and upper cervical spine: Craniocervical junction normal. Upper cervical spine within normal limits. Bone marrow signal intensity normal. No scalp soft tissue abnormality. Sinuses/Orbits: Patient status post bilateral ocular lens replacement. Paranasal sinuses are largely clear. No mastoid effusion. Other: None. IMPRESSION: 1. No acute intracranial abnormality. 2. Moderately advanced cerebral atrophy with chronic small vessel ischemic disease. Electronically Signed   By: Rise Mu M.D.   On: 06/25/2019 00:42       Subjective: Patient seen with daughter at bedside this afternoon.  Daughter states patient in memory care at [facility] and has supervision for all mobility.  States she is comfortable with patient returning there instead of going to a new environment for rehab.  Patient without any acute complaints.   Discharge Exam: Vitals:   06/26/19 0700 06/26/19 1417  BP: 137/76 (!) 149/50  Pulse: (!) 57 (!) 57  Resp: 17 20  Temp: 98.2 F (36.8 C) 97.9 F (36.6 C)  SpO2: 98% 98%   Vitals:   06/25/19 1800 06/25/19 2300 06/26/19 0700 06/26/19 1417  BP: 124/81 (!) 148/53 137/76 (!) 149/50  Pulse: (!) 56 (!) 51 (!) 57 (!) 57  Resp: 19 14 17 20   Temp:  98.3 F (36.8 C) 98.2  F (36.8 C) 97.9 F (36.6 C)  TempSrc:  Oral Oral Oral  SpO2: 97% 95% 98% 98%  Weight:      Height:        General: Pt is alert, awake, not in acute distress Cardiovascular: RRR, S1/S2 +, no rubs, no gallops Respiratory: CTA bilaterally, no wheezing, no rhonchi Abdominal: Soft, NT, ND, bowel sounds + Extremities: no edema, no cyanosis    The results of significant diagnostics from this hospitalization (including imaging, microbiology, ancillary and laboratory) are listed below for reference.     Microbiology: Recent Results (from the past 240 hour(s))  SARS CORONAVIRUS 2 (TAT 6-24 HRS) Nasopharyngeal Nasopharyngeal Swab     Status: None   Collection Time: 06/24/19  7:42 AM   Specimen: Nasopharyngeal Swab  Result Value Ref Range Status   SARS Coronavirus 2 NEGATIVE NEGATIVE Final    Comment: (NOTE) SARS-CoV-2 target nucleic acids are NOT DETECTED. The SARS-CoV-2 RNA is generally detectable in upper and lower respiratory specimens during the acute phase of infection.  Negative results do not preclude SARS-CoV-2 infection, do not rule out co-infections with other pathogens, and should not be used as the sole basis for treatment or other patient management decisions. Negative results must be combined with clinical observations, patient history, and epidemiological information. The expected result is Negative. Fact Sheet for Patients: SugarRoll.be Fact Sheet for Healthcare Providers: https://www.woods-mathews.com/ This test is not yet approved or cleared by the [place] FDA and  has been authorized for detection and/or diagnosis of SARS-CoV-2 by FDA under an Emergency Use Authorization (EUA). This EUA will remain  in effect (meaning this test can be used) for the duration of the COVID-19 declaration under Section 56 4(b)(1) of the Act, 21 U.S.C. section 360bbb-3(b)(1), unless the authorization is terminated or revoked  sooner. Performed at [facility], [place] [address] [place], [place] [address]   Urine culture     Status: Abnormal (Preliminary result)   Collection Time: 06/24/19  6:38 PM   Specimen: Urine, Random  Result Value Ref Range Status   Specimen Description   Final    URINE, RANDOM Performed at [facility], [address] [place], [place] [address]    Special Requests   Final    NONE Performed at [facility], [address], [place], [place] [address]    Culture >=100,000 COLONIES/mL ESCHERICHIA COLI (A)  Final   Report Status PENDING  Incomplete     Labs: BNP (last 3 results) No results for input(s): BNP in the last 8760 hours. Basic Metabolic Panel: Recent Labs  Lab 06/24/19 2053 06/25/19 0607 06/26/19 0643  NA 142 141 146*  K 4.2 3.9 4.4  CL 110 110 112*  CO2 26 24 27   GLUCOSE 162* 94 93  BUN 25* 21 18  CREATININE 0.98 0.89 0.79  CALCIUM 9.7 9.4 9.9   Liver Function Tests: Recent Labs  Lab 06/24/19 2053  AST 22  ALT 21  ALKPHOS 97  BILITOT 0.5  PROT 6.6  ALBUMIN 3.5   No results for input(s): LIPASE, AMYLASE in the last 168 hours. No results for input(s): AMMONIA in the last 168 hours. CBC: Recent Labs  Lab 06/24/19 1748 06/25/19 0607  WBC 4.8 6.8  NEUTROABS 3.1  --   HGB 13.9 13.3  HCT 41.1 39.4  MCV 98.6 99.2  PLT 167 141*   Cardiac Enzymes: No results for input(s): CKTOTAL, CKMB, CKMBINDEX, TROPONINI in the last 168 hours. BNP: Invalid input(s): POCBNP CBG: No results for input(s): GLUCAP in the last 168 hours. D-Dimer No results for input(s): DDIMER in the last 72 hours. Hgb A1c No results for input(s): HGBA1C in the last 72 hours. Lipid Profile Recent Labs    06/25/19 0607  CHOL 174  HDL 40*  LDLCALC 117*  TRIG 84  CHOLHDL 4.4   Thyroid function studies No results for input(s): TSH, T4TOTAL, T3FREE, THYROIDAB in the last 72 hours.  Invalid input(s): FREET3 Anemia work up No results for input(s):  VITAMINB12, FOLATE, FERRITIN, TIBC, IRON, RETICCTPCT in the last 72 hours. Urinalysis    Component Value Date/Time   COLORURINE STRAW (A) 06/24/2019 1838   APPEARANCEUR CLEAR (A) 06/24/2019 1838   APPEARANCEUR Clear 09/29/2013 2036   LABSPEC 1.006 06/24/2019 1838   LABSPEC 1.015 09/29/2013 2036   PHURINE 8.0 06/24/2019 1838   GLUCOSEU NEGATIVE 06/24/2019 1838   GLUCOSEU Negative 09/29/2013 2036   HGBUR NEGATIVE 06/24/2019 1838   BILIRUBINUR NEGATIVE 06/24/2019 1838   BILIRUBINUR Negative 09/29/2013 2036   KETONESUR NEGATIVE  06/24/2019 1838   PROTEINUR NEGATIVE 06/24/2019 1838   NITRITE NEGATIVE 06/24/2019 1838   LEUKOCYTESUR MODERATE (A) 06/24/2019 1838   LEUKOCYTESUR Negative 09/29/2013 2036   Sepsis Labs Invalid input(s): PROCALCITONIN,  WBC,  LACTICIDVEN Microbiology Recent Results (from the past 240 hour(s))  SARS CORONAVIRUS 2 (TAT 6-24 HRS) Nasopharyngeal Nasopharyngeal Swab     Status: None   Collection Time: 06/24/19  7:42 AM   Specimen: Nasopharyngeal Swab  Result Value Ref Range Status   SARS Coronavirus 2 NEGATIVE NEGATIVE Final    Comment: (NOTE) SARS-CoV-2 target nucleic acids are NOT DETECTED. The SARS-CoV-2 RNA is generally detectable in upper and lower respiratory specimens during the acute phase of infection. Negative results do not preclude SARS-CoV-2 infection, do not rule out co-infections with other pathogens, and should not be used as the sole basis for treatment or other patient management decisions. Negative results must be combined with clinical observations, patient history, and epidemiological information. The expected result is Negative. Fact Sheet for Patients: HairSlick.no Fact Sheet for Healthcare Providers: quierodirigir.com This test is not yet approved or cleared by the [place] FDA and  has been authorized for detection and/or diagnosis of SARS-CoV-2 by FDA under an Emergency Use  Authorization (EUA). This EUA will remain  in effect (meaning this test can be used) for the duration of the COVID-19 declaration under Section 56 4(b)(1) of the Act, 21 U.S.C. section 360bbb-3(b)(1), unless the authorization is terminated or revoked sooner. Performed at [facility], [address], [place], [place] [address]   Urine culture     Status: Abnormal (Preliminary result)   Collection Time: 06/24/19  6:38 PM   Specimen: Urine, Random  Result Value Ref Range Status   Specimen Description   Final    URINE, RANDOM Performed at [facility], [address] [place], [place] [address]    Special Requests   Final    NONE Performed at [facility], [address] [place], [place] [address]    Culture >=100,000 COLONIES/mL ESCHERICHIA COLI (A)  Final   Report Status PENDING  Incomplete     Time coordinating discharge: Over 30 minutes  SIGNED:   Pennie Banter, DO [facility] 06/26/2019, 3:19 PM   If 7PM-7AM, please contact night-coverage [email or web address]

## 2019-06-26 NOTE — Progress Notes (Signed)
Initial Nutrition Assessment  DOCUMENTATION CODES:   Not applicable  INTERVENTION:  Will downgrade diet to dysphagia 3 (mechanical soft).  Recommend increasing to Ensure Enlive po BID, each supplement provides 350 kcal and 20 grams of protein. Unable to order this since discharge and medication reconciliation has already been completed.  NUTRITION DIAGNOSIS:   Inadequate oral intake related to decreased appetite as evidenced by meal completion < 50%.  GOAL:   Patient will meet greater than or equal to 90% of their needs  MONITOR:   PO intake, Supplement acceptance, Labs, Weight trends, I & O's  REASON FOR ASSESSMENT:   Malnutrition Screening Tool    ASSESSMENT:   86 year old female with PMHx of multi-infarct dementia with behavioral changes, HTN, PVD, GERD, anxiety admitted from [facility] with presyncope and mild dehydration.   Met with patient at bedside. She was confused an unable to provide any history. Noted on MST it was reported that patient needs a soft diet. Attempted to assess patient's mouth during NFPE but patient reported "no" when RD asked to assess patient's mouth/dentation. Will downgrade diet to mechanical soft. No meal completion documented at this time so unable to tell how patient has been eating. Lunch tray was at bedside untouched at time of RD assessment. Patient now with discharge order.  Weight appears fairly stable in chart. Patient is 77-81 kg usually. She is currently 80 kg (176.37 lbs) if current weight is accurate.  Medications and labs reviewed.  Patient does not meet criteria for malnutrition at this time.  NUTRITION - FOCUSED PHYSICAL EXAM:    Most Recent Value  Orbital Region  No depletion  Upper Arm Region  No depletion  Thoracic and Lumbar Region  No depletion  Buccal Region  No depletion  Temple Region  Mild depletion  Clavicle Bone Region  No depletion  Clavicle and Acromion Bone Region  Mild depletion  Scapular Bone Region   Unable to assess  Dorsal Hand  Mild depletion  Patellar Region  Mild depletion  Anterior Thigh Region  Mild depletion  Posterior Calf Region  Mild depletion  Edema (RD Assessment)  None  Hair  Reviewed  Eyes  Reviewed  Mouth  Unable to assess  Skin  Reviewed  Nails  Reviewed     Diet Order:   Diet Order            Diet - low sodium heart healthy        Diet Heart Room service appropriate? Yes; Fluid consistency: Thin  Diet effective now             EDUCATION NEEDS:   No education needs have been identified at this time  Skin:  Skin Assessment: Skin Integrity Issues:(MSAD to sacrum)  Last BM:  Unknown  Height:   Ht Readings from Last 1 Encounters:  06/24/19 '5\' 5"'  (1.651 m)   Weight:   Wt Readings from Last 1 Encounters:  06/24/19 80 kg   Ideal Body Weight:  56.8 kg  BMI:  Body mass index is 29.35 kg/m.  Estimated Nutritional Needs:   Kcal:  1500-1700  Protein:  80-90 grams  Fluid:  1.5-1.7 L/day  Jacklynn Barnacle, MS, RD, LDN Pager number available on Amion

## 2019-06-26 NOTE — NC FL2 (Signed)
[place] LEVEL OF CARE SCREENING TOOL     IDENTIFICATION  Patient Name: [name] Birthdate: October 18, 1931 Sex: female Admission Date (Current Location): 06/24/2019  [place] and [place] Number:  Engineering geologist and Address:  [facility], [address], [place],  [address]      Provider Number:    Attending Physician Name and Address:  Ezekiel Slocumb, DO  Relative Name and Phone Number:  Luvenia Redden [phone]    Current Level of Care: Hospital Recommended Level of Care: Memory Care Prior Approval Number:    Date Approved/Denied:   PASRR Number:    Discharge Plan: Other (Comment)(Memory Care)    Current Diagnoses: Patient Active Problem List   Diagnosis Date Noted  . Pre-syncope 06/25/2019  . Acute metabolic encephalopathy 76/73/4193  . Dementia with behavioral disturbance ([place]) 06/25/2019    Orientation RESPIRATION BLADDER Height & Weight     Self  Normal External catheter Weight: 80 kg Height:  5\' 5"  (165.1 cm)  BEHAVIORAL SYMPTOMS/MOOD NEUROLOGICAL BOWEL NUTRITION STATUS      Incontinent Diet(Heart Healthy)  AMBULATORY STATUS COMMUNICATION OF NEEDS Skin   Limited Assist Verbally Normal                       Personal Care Assistance Level of Assistance  Bathing, Feeding, Dressing Bathing Assistance: Maximum assistance Feeding assistance: Maximum assistance Dressing Assistance: Maximum assistance     Functional Limitations Info             SPECIAL CARE FACTORS FREQUENCY                       Contractures Contractures Info: Not present    Additional Factors Info  Code Status, Allergies Code Status Info: DNR Allergies Info: No known allergies           Current Medications (06/26/2019):  This is the current hospital active medication list Discharge Medications: Medication List    STOP taking these medications   carvedilol 6.25 MG tablet Commonly known as:  COREG     TAKE these medications   acetaminophen 325 MG tablet Commonly known as: TYLENOL Take 650 mg by mouth 3 (three) times daily as needed.   alendronate 70 MG tablet Commonly known as: FOSAMAX Take 70 mg by mouth once a week.   amLODipine 5 MG tablet Commonly known as: NORVASC Take 5 mg by mouth daily.   atorvastatin 40 MG tablet Commonly known as: LIPITOR Take 40 mg by mouth daily.   calcium citrate-vitamin D 315-200 MG-UNIT tablet Commonly known as: CITRACAL+D Take 1 tablet by mouth 2 (two) times daily.   donepezil 10 MG tablet Commonly known as: ARICEPT Take 10 mg by mouth at bedtime.   Ensure Take 237 mLs by mouth daily.   guaifenesin 100 MG/5ML syrup Commonly known as: ROBITUSSIN Take 200 mg by mouth 3 (three) times daily as needed for cough.   hyoscyamine 0.125 MG tablet Commonly known as: LEVSIN Take 0.125 mg by mouth every 3 (three) hours as needed.   loperamide 2 MG capsule Commonly known as: IMODIUM Take 2 mg by mouth as needed for diarrhea or loose stools.   LORazepam 0.5 MG tablet Commonly known as: ATIVAN Take 0.5 mg by mouth every 4 (four) hours as needed for anxiety.   magnesium oxide 400 MG tablet Commonly known as: MAG-OX Take 400 mg by mouth 2 (two) times daily.   montelukast 10 MG  tablet Commonly known as: SINGULAIR Take 10 mg by mouth at bedtime.   neomycin-bacitracin-polymyxin [phone] ointment Apply 1 application topically 4 (four) times daily.   nystatin powder Generic drug: nystatin Apply 1 application topically 2 (two) times daily.   OLANZapine 2.5 MG tablet Commonly known as: ZYPREXA Take 1.25 mg by mouth daily.   omeprazole 20 MG capsule Commonly known as: PRILOSEC Take 20 mg by mouth daily.   ondansetron 4 MG tablet Commonly known as: Zofran Take 1 tablet (4 mg total) by mouth every 8 (eight) hours as needed for nausea or vomiting.   polyethylene glycol 17 g packet Commonly known as:  MIRALAX / GLYCOLAX Take 17 g by mouth daily.   QUEtiapine 25 MG tablet Commonly known as: SEROQUEL Take 25 mg by mouth daily. At 2 pm   QUEtiapine 50 MG tablet Commonly known as: SEROQUEL Take 50 mg by mouth at bedtime.   risperiDONE 1 MG/ML oral solution Commonly known as: RISPERDAL Take 0.25 mg by mouth 2 (two) times daily.   saccharomyces boulardii 250 MG capsule Commonly known as: FLORASTOR Take 250 mg by mouth 2 (two) times daily.   traZODone 50 MG tablet Commonly known as: DESYREL Take 25 mg by mouth at bedtime.   Turmeric Curcumin 500 MG Caps Take 500 mg by mouth daily.   valproic acid 250 MG/5ML solution Commonly known as: DEPAKENE Take 250 mg by mouth 2 (two) times daily.      Please see discharge summary for a list of discharge medications.  Relevant Imaging Results:  Relevant Lab Results:   Additional Information [id number]  Trenton Founds, RN

## 2019-06-27 LAB — URINE CULTURE: Culture: >=100000 — AB

## 2020-10-06 ENCOUNTER — Emergency Department
Admission: EM | Admit: 2020-10-06 | Discharge: 2020-10-06 | Disposition: A | Discharge status: Home / Self Care | Attending: Emergency Medicine | Admitting: Emergency Medicine

## 2020-10-06 ENCOUNTER — Other Ambulatory Visit: Payer: Self-pay

## 2020-10-06 ENCOUNTER — Encounter: Payer: Self-pay | Admitting: Emergency Medicine

## 2020-10-06 DIAGNOSIS — L03116 Cellulitis of left lower limb: Secondary | ICD-10-CM | POA: Diagnosis not present

## 2020-10-06 DIAGNOSIS — R600 Localized edema: Secondary | ICD-10-CM | POA: Diagnosis present

## 2020-10-06 DIAGNOSIS — Z85828 Personal history of other malignant neoplasm of skin: Secondary | ICD-10-CM | POA: Insufficient documentation

## 2020-10-06 DIAGNOSIS — F0390 Unspecified dementia without behavioral disturbance: Secondary | ICD-10-CM | POA: Diagnosis not present

## 2020-10-06 LAB — BASIC METABOLIC PANEL
Anion gap: 7 (ref 5–15)
BUN: 18 mg/dL (ref 8–23)
CO2: 27 mmol/L (ref 22–32)
Calcium: 10.1 mg/dL (ref 8.9–10.3)
Chloride: 107 mmol/L (ref 98–111)
Creatinine, Ser: 1.1 mg/dL — ABNORMAL HIGH (ref 0.44–1.00)
GFR, Estimated: 49 mL/min — ABNORMAL LOW (ref >60)
Glucose, Bld: 114 mg/dL — ABNORMAL HIGH (ref 70–99)
Potassium: 4 mmol/L (ref 3.5–5.1)
Sodium: 141 mmol/L (ref 135–145)

## 2020-10-06 LAB — CBC
HCT: 41.6 % (ref 36.0–46.0)
Hemoglobin: 14.2 g/dL (ref 12.0–15.0)
MCH: 32.3 pg (ref 26.0–34.0)
MCHC: 34.1 g/dL (ref 30.0–36.0)
MCV: 94.8 fL (ref 80.0–100.0)
Platelets: 229 10*3/uL (ref 150–400)
RBC: 4.39 MIL/uL (ref 3.87–5.11)
RDW: 13.5 % (ref 11.5–15.5)
WBC: 7.4 10*3/uL (ref 4.0–10.5)
nRBC: 0 % (ref 0.0–0.2)

## 2020-10-06 MED ORDER — CEPHALEXIN 500 MG PO CAPS: 500.0000 mg | ORAL_CAPSULE | Freq: Two times a day (BID) | ORAL | 0 refills | Status: AC

## 2020-10-06 MED ORDER — CEPHALEXIN 500 MG PO CAPS
500.0000 mg | ORAL_CAPSULE | Freq: Once | ORAL | Status: AC
  Administered 2020-10-06: 500 mg via ORAL
  Filled 2020-10-06: qty 1

## 2020-10-06 NOTE — ED Triage Notes (Addendum)
Pt comes into the ED via [facility] from [facility] c/o bilateral foot swelling that has been increasing.  Facility denies any known h/o of CHF but patient is on Lasix. Pt denies any injury to the foot.    97%   138/59 83

## 2020-10-06 NOTE — ED Notes (Addendum)
See triage note  Presents with family from [facility]   Presents with swelling to both feet  Per daughter she is not able to ambulate   The home will not take her back  Swelling noted to both feet  Worse on left with some redness

## 2020-10-06 NOTE — ED Provider Notes (Signed)
[facility] Emergency Department Provider Note   ____________________________________________    I have reviewed the triage vital signs and the nursing notes.   HISTORY  Chief Complaint Edema  History limited by dementia   HPI [name] is a 87 y.o. female who presents with lower extremity swelling, particularly in the left foot.  Apparently this is been ongoing for the last 2 weeks per daughter.  Was put on some antibiotics which may have helped, MAR suggest this was Bactrim.  No fevers reported.  No calf pain or swelling.  Patient was also started on Lasix which seems to have improved.  Patient has had significant pain but that has also gradually improved.  Still has mild redness there.  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 No pertinent surgical history.  Prior to Admission medications   Medication Sig Start Date End Date Taking? Authorizing Provider  cephALEXin (KEFLEX) 500 MG capsule Take 1 capsule (500 mg total) by mouth 2 (two) times daily. 10/06/20  Yes Jene Every, MD  acetaminophen (TYLENOL) 325 MG tablet Take 650 mg by mouth 3 (three) times daily as needed.    [provider]  alendronate (FOSAMAX) 70 MG tablet Take 70 mg by mouth once a week.    [provider]  amLODipine (NORVASC) 5 MG tablet Take 5 mg by mouth daily.    [provider]  atorvastatin (LIPITOR) 40 MG tablet Take 40 mg by mouth daily.    [provider]  calcium citrate-vitamin D (CITRACAL+D) 315-200 MG-UNIT tablet Take 1 tablet by mouth 2 (two) times daily.     [provider]  donepezil (ARICEPT) 10 MG tablet Take 10 mg by mouth at bedtime.    [provider]  ENSURE (ENSURE) Take 237 mLs by mouth daily.    [provider]  guaifenesin (ROBITUSSIN) 100 MG/5ML syrup Take 200 mg by mouth 3 (three) times daily as needed for cough.    [provider]  hyoscyamine (LEVSIN) 0.125 MG tablet Take 0.125 mg by mouth every 3 (three) hours as needed.    [provider]  loperamide (IMODIUM) 2 MG capsule Take 2 mg by mouth as needed for diarrhea or loose stools.    [provider]  LORazepam (ATIVAN) 0.5 MG tablet Take 0.5 mg by mouth every 4 (four) hours as needed for anxiety.    [provider]  magnesium oxide (MAG-OX) 400 MG tablet Take 400 mg by mouth 2 (two) times daily.     [provider]  montelukast (SINGULAIR) 10 MG tablet Take 10 mg by mouth at bedtime.    [provider]  neomycin-bacitracin-polymyxin (NEOSPORIN) [phone] ointment Apply 1 application topically 4 (four) times daily.    [provider]  nystatin (NYSTATIN) powder Apply 1 application topically 2 (two) times daily.    [provider]  OLANZapine (ZYPREXA) 2.5 MG tablet Take 1.25 mg by mouth daily.    [provider]  omeprazole (PRILOSEC) 20 MG capsule Take 20 mg by mouth daily.    [provider]  ondansetron [facility])  4 MG tablet Take 1 tablet (4 mg total) by mouth every 8 (eight) hours as needed for nausea or vomiting. 03/15/18   Jeanmarie Plant, MD  polyethylene glycol (MIRALAX / GLYCOLAX) packet Take 17 g by mouth daily.    [provider]  QUEtiapine (SEROQUEL) 25 MG tablet Take 25 mg by mouth daily. At 2 pm    [provider]  QUEtiapine (SEROQUEL) 50 MG tablet Take 50 mg by mouth at bedtime.    [provider]  risperiDONE (RISPERDAL) 1 MG/ML oral solution Take 0.25 mg by mouth 2 (two) times daily.    [provider]  saccharomyces  boulardii (FLORASTOR) 250 MG capsule Take 250 mg by mouth 2 (two) times daily.    [provider]  traZODone (DESYREL) 50 MG tablet Take 25 mg by mouth at bedtime.    [provider]  Turmeric Curcumin 500 MG CAPS Take 500 mg by mouth daily.    [provider]  valproic acid (DEPAKENE) 250 MG/5ML SOLN solution Take 250 mg by mouth 2 (two) times daily.    [provider]     Allergies Patient has no known allergies.  History reviewed. No pertinent family history.  Social History Social History   Tobacco Use   Smoking status: Never   Smokeless tobacco: Never  Substance Use Topics   Alcohol use: No    Review of Systems  Constitutional: No fever/chills Eyes: No visual changes.  ENT: No sore throat. Cardiovascular: Denies chest pain. Respiratory: Denies shortness of breath. Gastrointestinal: No abdominal pain.  No nausea, no vomiting.   Genitourinary: Negative for dysuria. Musculoskeletal: As above Skin: As above Neurological: Negative for headaches    ____________________________________________   PHYSICAL EXAM:  VITAL SIGNS: ED Triage Vitals  Enc Vitals Group     BP 10/06/20 0645 127/69     Pulse Rate 10/06/20 0645 74     Resp 10/06/20 0645 20     Temp 10/06/20 0645 97.7 F (36.5 C)     Temp Source 10/06/20 0645 Oral     SpO2 10/06/20 0645 99 %     Weight 10/06/20 0655 80 kg (176 lb 5.9 oz)     Height 10/06/20 0655 1.651 m (5\' 5" )     Head Circumference --      Peak Flow --      Pain Score 10/06/20 1050 0     Pain Loc --      Pain Edu? --      Excl. in GC? --     Constitutional: Alert No acute distress.   Nose: No congestion/rhinnorhea. Mouth/Throat: Mucous membranes are moist.    Cardiovascular: Normal rate, regular rhythm. Grossly normal heart sounds.  Good peripheral circulation. Respiratory: Normal respiratory effort.  No retractions. Lungs CTAB. Gastrointestinal: Soft and nontender. No distention.  No CVA  tenderness.  Musculoskeletal: Mild lower extremity swelling to the left foot with some erythema to the mid and forefoot adjacent to the great toe.  Fungal infection between toes noted, both feet are warm and well perfused, no calf tenderness Neurologic:  Normal speech and language. No gross focal neurologic deficits are appreciated.  Skin:  Skin is warm, dry and intact. No rash noted.   ____________________________________________   LABS (all labs ordered are listed, but only abnormal results are displayed)  Labs Reviewed  BASIC METABOLIC PANEL - Abnormal; Notable for the following components:      Result Value   Glucose, Bld 114 (*)  Creatinine, Ser 1.10 (*)    GFR, Estimated 49 (*)    All other components within normal limits  CBC   ____________________________________________  EKG  None ____________________________________________  RADIOLOGY  None ____________________________________________   PROCEDURES  Procedure(s) performed: No  Procedures   Critical Care performed: No ____________________________________________   INITIAL IMPRESSION / ASSESSMENT AND PLAN / ED COURSE  Pertinent labs & imaging results that were available during my care of the patient were reviewed by me and considered in my medical decision making (see chart for details).   Exam is most consistent with mild cellulitis of the left foot, not consistent with gout given no history.  Areas of significantly dry skin and fungal infection likely causing skin break and entrance for bacteria.  Patient on Bactrim which would not provide great coverage for strep bacteria, will add Keflex.  Recommend outpatient follow-up.    ____________________________________________   FINAL CLINICAL IMPRESSION(S) / ED DIAGNOSES  Final diagnoses:  Cellulitis of left lower extremity        Note:  This document was prepared using Dragon voice recognition software and may include unintentional dictation  errors.    Jene Every, MD 10/06/20 1315

## 2020-10-06 NOTE — ED Notes (Signed)
Has been resting at present  Awaiting placement  Daughter has been in and out with her

## 2020-10-06 NOTE — ED Notes (Signed)
Spoke with Ms [name] concerning placement  Unsure of time or place at present  Family made aware

## 2021-03-15 DEATH — deceased
